# Patient Record
Sex: Male | Born: 1955 | Race: Black or African American | Hispanic: No | Marital: Married | Smoking: Current every day smoker
Health system: Southern US, Community
[De-identification: ages and names within clinical notes are randomized; demographics above are authoritative.]

## PROBLEM LIST (undated history)

## (undated) DIAGNOSIS — F1010 Alcohol abuse, uncomplicated: Secondary | ICD-10-CM

## (undated) DIAGNOSIS — Z860100 Personal history of colon polyps, unspecified: Secondary | ICD-10-CM

## (undated) DIAGNOSIS — B189 Chronic viral hepatitis, unspecified: Secondary | ICD-10-CM

## (undated) DIAGNOSIS — M1990 Unspecified osteoarthritis, unspecified site: Secondary | ICD-10-CM

## (undated) DIAGNOSIS — K759 Inflammatory liver disease, unspecified: Secondary | ICD-10-CM

## (undated) DIAGNOSIS — R1310 Dysphagia, unspecified: Secondary | ICD-10-CM

## (undated) DIAGNOSIS — B182 Chronic viral hepatitis C: Secondary | ICD-10-CM

## (undated) DIAGNOSIS — F1420 Cocaine dependence, uncomplicated: Secondary | ICD-10-CM

## (undated) DIAGNOSIS — J45909 Unspecified asthma, uncomplicated: Secondary | ICD-10-CM

## (undated) DIAGNOSIS — I10 Essential (primary) hypertension: Secondary | ICD-10-CM

## (undated) DIAGNOSIS — E119 Type 2 diabetes mellitus without complications: Secondary | ICD-10-CM

## (undated) HISTORY — DX: Chronic viral hepatitis C: B18.2

## (undated) HISTORY — PX: KNEE ARTHROPLASTY: SHX992

## (undated) HISTORY — DX: Dysphagia, unspecified: R13.10

## (undated) HISTORY — PX: COLONOSCOPY: SHX174

## (undated) HISTORY — DX: Cocaine dependence, uncomplicated: F14.20

## (undated) HISTORY — DX: Personal history of colonic polyps: Z86.010

## (undated) HISTORY — DX: Personal history of colon polyps, unspecified: Z86.0100

## (undated) HISTORY — PX: KNEE ARTHROSCOPY: SUR90

## (undated) HISTORY — DX: Alcohol abuse, uncomplicated: F10.10

## (undated) HISTORY — DX: Chronic viral hepatitis, unspecified: B18.9

---

## 2002-03-01 ENCOUNTER — Ambulatory Visit (HOSPITAL_COMMUNITY): Admission: RE | Admit: 2002-03-01 | Discharge: 2002-03-01 | Payer: Self-pay | Admitting: General Surgery

## 2002-05-12 ENCOUNTER — Emergency Department (HOSPITAL_COMMUNITY): Admission: EM | Admit: 2002-05-12 | Discharge: 2002-05-12 | Payer: Self-pay | Admitting: *Deleted

## 2002-09-12 ENCOUNTER — Encounter (HOSPITAL_COMMUNITY): Admission: RE | Admit: 2002-09-12 | Discharge: 2002-10-12 | Payer: Self-pay | Admitting: General Surgery

## 2003-11-13 ENCOUNTER — Ambulatory Visit (HOSPITAL_COMMUNITY): Admission: RE | Admit: 2003-11-13 | Discharge: 2003-11-13 | Payer: Self-pay | Admitting: General Surgery

## 2004-07-24 ENCOUNTER — Ambulatory Visit (HOSPITAL_COMMUNITY): Admission: RE | Admit: 2004-07-24 | Discharge: 2004-07-24 | Payer: Self-pay | Admitting: Internal Medicine

## 2005-07-02 ENCOUNTER — Ambulatory Visit (HOSPITAL_COMMUNITY): Admission: RE | Admit: 2005-07-02 | Discharge: 2005-07-02 | Payer: Self-pay | Admitting: General Surgery

## 2008-01-11 ENCOUNTER — Ambulatory Visit (HOSPITAL_COMMUNITY): Admission: RE | Admit: 2008-01-11 | Discharge: 2008-01-11 | Payer: Self-pay | Admitting: Family Medicine

## 2008-01-18 ENCOUNTER — Ambulatory Visit: Payer: Self-pay | Admitting: Gastroenterology

## 2008-02-10 ENCOUNTER — Encounter: Payer: Self-pay | Admitting: Gastroenterology

## 2008-02-13 ENCOUNTER — Ambulatory Visit (HOSPITAL_COMMUNITY): Admission: RE | Admit: 2008-02-13 | Discharge: 2008-02-13 | Payer: Self-pay | Admitting: Gastroenterology

## 2008-02-13 ENCOUNTER — Ambulatory Visit: Payer: Self-pay | Admitting: Gastroenterology

## 2008-02-13 ENCOUNTER — Encounter: Payer: Self-pay | Admitting: Gastroenterology

## 2008-02-21 ENCOUNTER — Ambulatory Visit (HOSPITAL_COMMUNITY): Admission: RE | Admit: 2008-02-21 | Discharge: 2008-02-21 | Payer: Self-pay | Admitting: Family Medicine

## 2008-02-29 ENCOUNTER — Encounter: Payer: Self-pay | Admitting: Gastroenterology

## 2008-03-13 ENCOUNTER — Ambulatory Visit: Payer: Self-pay | Admitting: Gastroenterology

## 2008-03-14 ENCOUNTER — Encounter: Payer: Self-pay | Admitting: Gastroenterology

## 2008-03-20 ENCOUNTER — Encounter: Payer: Self-pay | Admitting: Gastroenterology

## 2008-03-20 DIAGNOSIS — R1013 Epigastric pain: Secondary | ICD-10-CM | POA: Insufficient documentation

## 2008-03-20 DIAGNOSIS — F1010 Alcohol abuse, uncomplicated: Secondary | ICD-10-CM | POA: Insufficient documentation

## 2008-03-21 ENCOUNTER — Encounter: Admission: RE | Admit: 2008-03-21 | Discharge: 2008-03-21 | Payer: Self-pay | Admitting: Orthopedic Surgery

## 2008-03-30 ENCOUNTER — Telehealth: Payer: Self-pay | Admitting: Gastroenterology

## 2008-05-08 ENCOUNTER — Encounter: Admission: RE | Admit: 2008-05-08 | Discharge: 2008-05-08 | Payer: Self-pay | Admitting: Orthopedic Surgery

## 2008-06-01 ENCOUNTER — Ambulatory Visit (HOSPITAL_COMMUNITY): Admission: RE | Admit: 2008-06-01 | Discharge: 2008-06-01 | Payer: Self-pay | Admitting: Family Medicine

## 2008-06-29 ENCOUNTER — Ambulatory Visit (HOSPITAL_COMMUNITY): Admission: RE | Admit: 2008-06-29 | Discharge: 2008-06-29 | Payer: Self-pay | Admitting: Family Medicine

## 2008-09-05 ENCOUNTER — Ambulatory Visit (HOSPITAL_COMMUNITY): Admission: RE | Admit: 2008-09-05 | Discharge: 2008-09-05 | Payer: Self-pay | Admitting: Orthopedic Surgery

## 2008-09-20 ENCOUNTER — Encounter (HOSPITAL_COMMUNITY): Admission: RE | Admit: 2008-09-20 | Discharge: 2008-10-11 | Payer: Self-pay | Admitting: Orthopedic Surgery

## 2008-10-12 HISTORY — PX: TRIGGER FINGER RELEASE: SHX641

## 2008-10-15 ENCOUNTER — Encounter (HOSPITAL_COMMUNITY): Admission: RE | Admit: 2008-10-15 | Discharge: 2008-10-29 | Payer: Self-pay | Admitting: Orthopedic Surgery

## 2009-02-14 ENCOUNTER — Encounter: Payer: Self-pay | Admitting: Gastroenterology

## 2009-05-15 ENCOUNTER — Encounter: Admission: RE | Admit: 2009-05-15 | Discharge: 2009-05-15 | Payer: Self-pay | Admitting: Orthopedic Surgery

## 2009-06-02 ENCOUNTER — Encounter: Admission: RE | Admit: 2009-06-02 | Discharge: 2009-06-02 | Payer: Self-pay | Admitting: Orthopedic Surgery

## 2009-06-06 ENCOUNTER — Encounter (INDEPENDENT_AMBULATORY_CARE_PROVIDER_SITE_OTHER): Payer: Self-pay | Admitting: *Deleted

## 2009-07-27 ENCOUNTER — Encounter: Admission: RE | Admit: 2009-07-27 | Discharge: 2009-07-27 | Payer: Self-pay | Admitting: Neurosurgery

## 2009-08-15 ENCOUNTER — Telehealth (INDEPENDENT_AMBULATORY_CARE_PROVIDER_SITE_OTHER): Payer: Self-pay

## 2009-08-16 ENCOUNTER — Emergency Department (HOSPITAL_COMMUNITY): Admission: EM | Admit: 2009-08-16 | Discharge: 2009-08-16 | Payer: Self-pay | Admitting: Emergency Medicine

## 2009-09-09 ENCOUNTER — Encounter (HOSPITAL_COMMUNITY): Admission: RE | Admit: 2009-09-09 | Discharge: 2009-10-09 | Payer: Self-pay | Admitting: Neurosurgery

## 2009-10-12 HISTORY — PX: ESOPHAGOGASTRODUODENOSCOPY: SHX1529

## 2009-11-19 ENCOUNTER — Encounter: Payer: Self-pay | Admitting: Gastroenterology

## 2010-06-20 DIAGNOSIS — Z860100 Personal history of colon polyps, unspecified: Secondary | ICD-10-CM | POA: Insufficient documentation

## 2010-06-20 DIAGNOSIS — B182 Chronic viral hepatitis C: Secondary | ICD-10-CM | POA: Insufficient documentation

## 2010-06-20 DIAGNOSIS — F1420 Cocaine dependence, uncomplicated: Secondary | ICD-10-CM | POA: Insufficient documentation

## 2010-06-24 ENCOUNTER — Ambulatory Visit: Payer: Self-pay | Admitting: Gastroenterology

## 2010-06-24 DIAGNOSIS — R1084 Generalized abdominal pain: Secondary | ICD-10-CM | POA: Insufficient documentation

## 2010-06-24 DIAGNOSIS — R1319 Other dysphagia: Secondary | ICD-10-CM | POA: Insufficient documentation

## 2010-06-30 ENCOUNTER — Ambulatory Visit (HOSPITAL_COMMUNITY): Admission: RE | Admit: 2010-06-30 | Discharge: 2010-06-30 | Payer: Self-pay | Admitting: Gastroenterology

## 2010-06-30 ENCOUNTER — Ambulatory Visit: Payer: Self-pay | Admitting: Gastroenterology

## 2010-07-09 ENCOUNTER — Encounter (INDEPENDENT_AMBULATORY_CARE_PROVIDER_SITE_OTHER): Payer: Self-pay
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
## 2010-11-13 NOTE — Letter (Signed)
Summary: TCS/EGD/ED ORDER  TCS/EGD/ED ORDER   Imported By: Ave Filter 06/24/2010 14:41:09  _____________________________________________________________________  External Attachment:    Type:   Image     Comment:   External Document

## 2010-11-13 NOTE — Letter (Signed)
Summary: OFFICE NOTE/LIVER CLINIC  OFFICE NOTE/LIVER CLINIC   Imported By: Diana Eves 11/19/2009 16:19:43  _____________________________________________________________________  External Attachment:    Type:   Image     Comment:   External Document

## 2010-11-13 NOTE — Letter (Signed)
Summary: Plan of Care, Need to Discuss  [facility]  [address]   [place], [place] [address]   Phone: [phone]  Fax: [phone]    July 09, 2010  [name] [address] [place], [place]  [address] Jul 15, 1956   Dear [name],   We are writing this letter to inform you of treatment plans and/or discuss your plan of care.  We have tried several times to contact you; however, we have yet to reach you.  We ask that you please contact our office for follow-up on your gastrointestinal issues.  We can  be reached at [phone] to schedule an appointment, or to speak with someone regarding your health care needs.  Please do not neglect your health.   Sincerely,    Cloria Spring LPN  [facility] Ph: [phone]    Fax: [phone]

## 2010-12-05 ENCOUNTER — Encounter (INDEPENDENT_AMBULATORY_CARE_PROVIDER_SITE_OTHER): Payer: Self-pay | Admitting: *Deleted

## 2010-12-09 NOTE — Letter (Signed)
Summary: Recall Office Visit  [facility]  [address]   [place], [place] [address]   Phone: [phone]  Fax: [phone]      December 05, 2010   LELAND RAVER [address] [place], [place]  [address] 05/26/1956   Dear Mr. Markel,   According to our records, it is time for you to schedule a follow-up office visit with [place].   At your convenience, please call [phone] to schedule an office visit. If you have any questions, concerns, or feel that this letter is in error, we would appreciate your call.   Sincerely,    Diana Eves  [facility] Ph: [phone]   Fax: [phone]

## 2011-01-14 LAB — URINALYSIS, ROUTINE W REFLEX MICROSCOPIC
Glucose, UA: 100 mg/dL — AB
Hgb urine dipstick: NEGATIVE
Ketones, ur: 15 mg/dL — AB
Leukocytes, UA: NEGATIVE
Nitrite: POSITIVE — AB
Protein, ur: 100 mg/dL — AB
Specific Gravity, Urine: 1.025 (ref 1.005–1.030)
Urobilinogen, UA: 1 mg/dL (ref 0.0–1.0)
pH: 5.5 (ref 5.0–8.0)

## 2011-01-14 LAB — DIFFERENTIAL
Basophils Absolute: 0 10*3/uL (ref 0.0–0.1)
Basophils Relative: 0 % (ref 0–1)
Eosinophils Absolute: 0.3 10*3/uL (ref 0.0–0.7)
Eosinophils Relative: 3 % (ref 0–5)
Lymphocytes Relative: 22 % (ref 12–46)
Lymphs Abs: 2.1 10*3/uL (ref 0.7–4.0)
Monocytes Absolute: 0.7 10*3/uL (ref 0.1–1.0)
Monocytes Relative: 7 % (ref 3–12)
Neutro Abs: 6.4 10*3/uL (ref 1.7–7.7)
Neutrophils Relative %: 68 % (ref 43–77)

## 2011-01-14 LAB — COMPREHENSIVE METABOLIC PANEL
ALT: 61 U/L — ABNORMAL HIGH (ref 0–53)
AST: 41 U/L — ABNORMAL HIGH (ref 0–37)
Albumin: 4.3 g/dL (ref 3.5–5.2)
Alkaline Phosphatase: 89 U/L (ref 39–117)
BUN: 13 mg/dL (ref 6–23)
CO2: 23 mEq/L (ref 19–32)
Calcium: 9.2 mg/dL (ref 8.4–10.5)
Chloride: 105 mEq/L (ref 96–112)
Creatinine, Ser: 1.35 mg/dL (ref 0.4–1.5)
GFR calc Af Amer: >60 mL/min (ref >60)
GFR calc non Af Amer: 55 mL/min — ABNORMAL LOW (ref >60)
Glucose, Bld: 117 mg/dL — ABNORMAL HIGH (ref 70–99)
Potassium: 3.5 mEq/L (ref 3.5–5.1)
Sodium: 138 mEq/L (ref 135–145)
Total Bilirubin: 1.2 mg/dL (ref 0.3–1.2)
Total Protein: 8.2 g/dL (ref 6.0–8.3)

## 2011-01-14 LAB — CBC
HCT: 51.4 % (ref 39.0–52.0)
Hemoglobin: 18.3 g/dL — ABNORMAL HIGH (ref 13.0–17.0)
MCHC: 35.6 g/dL (ref 30.0–36.0)
MCV: 91.7 fL (ref 78.0–100.0)
Platelets: 265 10*3/uL (ref 150–400)
RBC: 5.6 MIL/uL (ref 4.22–5.81)
RDW: 12.8 % (ref 11.5–15.5)
WBC: 9.4 10*3/uL (ref 4.0–10.5)

## 2011-01-14 LAB — URINE MICROSCOPIC-ADD ON
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
## 2011-02-24 NOTE — Assessment & Plan Note (Signed)
NAME:  [name], [name]              CHART#:  [id number]   DATE:  03/13/2008                       DOB:  1956/04/28   REFERRING PHYSICIAN:  Annia Friendly. Hill, MD.   PROBLEM LIST:  1. History of H. pylori gastritis with completion of amoxicillin and      Flagyl.  2. History of alcohol abuse and substance abuse with rehab in March      2009.  3. Epigastric abdominal pain.  4. Duodenal polyp on EGD in May 2009 with the pathology showing benign      small bowel mucosa.  5. Hepatitis C.  6. Personal history of colon polyps.   SUBJECTIVE:  Mr. Wainwright is a 55 year old male who was initially seen  by me in May 2009 for his endoscopy.  He was complaining of epigastric  pain and difficulty swallowing.  His upper endoscopy showed a normal  esophagus with patchy erythema in his body of the stomach and biopsies  were positive for H. pylori.  He took his amoxicillin and his Flagyl.  He also was given information on hepatitis C preventing transmission to  his wife or any other family members.  He returns complaining that he  still has pain in his epigastrium.  He has had no alcohol for 60 days.  He used to drink alcohol on the weekends and snort cocaine.  He said he  snorted cocaine off and on for a year and shared straws.  Prior to  approximately 3 months ago, Nexium worked for his epigastric pain, but  now it is not because he is having pain in his abdomen daily in spite of  being treated for H. Pylori gastritis.  He has nausea when he has this  burning sensation.  He has only vomited once.  He smokes a pack a day.   ALLERGIES:  No known drug allergies.   MEDICATIONS:  1. Nexium 40 mg daily.  2. Benicar daily.   OBJECTIVE:  VITAL SIGNS:  Weight 208 pounds, height 5 feet 9, BMI 30.7  (obese), temperature 97.8, blood pressure 130/90, and pulse 88.GENERAL:  He is in no apparent distress.  Alert and oriented x4.LUNGS:  Clear to  auscultation bilaterally.CARDIOVASCULAR:  Regular rhythm.  No  murmurs.ABDOMEN:  Bowel sounds are present, soft, with mild tenderness  to palpation in the epigastrium without rebound or guarding.   ASSESSMENT:  Mr. Rylee is a 55 year old male who has history of  alcohol abuse and substance abuse with continued epigastric pain in  spite of appropriate therapy for H. pylori gastritis.  He also has  hepatitis C.  He has gastroesophageal reflux disease and is maintained  on Nexium once a day and had no evidence of erosive esophagitis.  The  differential diagnosis for his continued epigastric pain includes  inadequately controlled gastroesophageal reflux disease or chronic  pancreatitis due to his alcohol abuse, which was not seen on ultrasound  .  He has a low likelihood of having pancreatic malignancy.   Thank you for allowing me to see Mr. Tingler in consultation.  My  recommendations are as follows.   RECOMMENDATIONS:  1. Mr. Gladish is given his discharge instructions in writing for the      management of his abdominal pain and his hepatitis C.  He is asked      to increase his  Nexium to 40 mg 30 minutes before breakfast and      dinner, and he is given a prescription for the new regimen.  2. He is asked to follow reflux recommendations and given a handout.  3. I will give him a referral to [facility] for hepatitis C treatment.  He      did not want to wait for an appointment in [place], because he      is concerned about having hepatitis C because his sister reportedly      had hepatitis C from a blood transfusion and needed a liver      transplant.  He has had no evidence of cirrhosis on ultrasound      obtained at [facility] in April 2009.  4. Will schedule an endoscopic ultrasound with Dr. Wendall Papa to      evaluate for chronic pancreatitis.  If he has chronic pancreatitis,      then we would begin an outpatient regimen for pain management,      which would include Neurontin and/or a tricyclic antidepressant.  5. He has a follow  up appointment to see me in 4 months.       Kassie Mends, M.D.  Electronically Signed     SM/MEDQ  D:  03/13/2008  T:  03/14/2008  Job:  [id number]   cc:   Annia Friendly. Loleta Chance, MD

## 2011-02-24 NOTE — Op Note (Signed)
NAMEVALLEN, CALABRESE             ACCOUNT NO.:  [id number]   MEDICAL RECORD NO.:  [id number]          PATIENT TYPE:  AMB   LOCATION:  SDS                          FACILITY:  [facility]   PHYSICIAN:  Myrtie Neither, MD      DATE OF BIRTH:  Aug 29, 1956   DATE OF PROCEDURE:  09/05/2008  DATE OF DISCHARGE:  09/05/2008                               OPERATIVE REPORT   PREOPERATIVE DIAGNOSIS:  Internal derangement with meniscal tear, right  knee.   POSTOPERATIVE DIAGNOSES:  1. Chondral defect, lateral femoral condyle.  2. Lateral meniscal tear.  3. Chronic synovitis, right knee.   ANESTHESIA:  General.   PROCEDURE:  1. Arthroscopic complete lateral meniscectomy.  2. Chondroplasty, lateral femoral condyle.  3. Synovectomy.   The patient was taken to the operating room after given adequate preop  medications, given general anesthesia and intubated.  Right knee was  prepped with DuraPrep and draped in the sterile manner.  Tourniquet was  used for hemostasis.  An one-half inch puncture wound was made along the  anterior, medial, and lateral joint line.  Inflow was through the medial  suprapatellar pouch area.  Inspection of the joint revealed chronic  synovitic changes involving the anterior compartment, medial and lateral  compartment.  This was completely resected.  Chondroplasty was then done  of the large 50% sized piece chondral defect involving the lateral  femoral condyle.  The lateral meniscus was severely chewed up with loose  fragments with use of basket forceps and meniscal shaver.  Complete  synovectomy was done.  Further inspection of the joint did not reveal  any other loose fragments.  ACL was intact.  Medial compartment was well  preserved.  Wound closure was then done with 0 nylon.  15 mL of 0.25%  plain Marcaine was injected into the joint.  Compressive dressing was  applied.  The patient tolerated the procedure quite well and went to the  recovery room in stable and  satisfactory position.   The patient is being discharged on Percocet 1-2 q.6 p.r.n. for pain, ice  packs, nonweightbearing on the right side with the use of crutches and  return to the office in 1 week.  The patient is being discharged in  stable and satisfactory position.      Myrtie Neither, MD  Electronically Signed     AC/MEDQ  D:  09/05/2008  T:  09/05/2008  Job:  [id number]

## 2011-02-24 NOTE — Op Note (Signed)
[name], [name]             ACCOUNT NO.:  [id number]   MEDICAL RECORD NO.:  [id number]          PATIENT TYPE:  AMB   LOCATION:  DAY                           FACILITY:  [facility]   PHYSICIAN:  Kassie Mends, M.D.      DATE OF BIRTH:  1956/04/06   DATE OF PROCEDURE:  02/13/2008  DATE OF DISCHARGE:                               OPERATIVE REPORT   REFERRING PHYSICIAN:  Annia Friendly. Hill, MD   PROCEDURE:  Esophagogastroduodenoscopy with cold forceps biopsy of the  duodenal and gastric mucosa.   INDICATION FOR EXAM:  [name] is a 55 year old male who complains  of epigastric pain and difficulty swallowing.  He has a history of  snorting cocaine and alcohol use.  He has been in rehab for the last  month and a half.  He has a longstanding history of gastroesophageal  reflux disease and is maintained on Nexium once a day.   FINDINGS:  1. Normal esophagus without evidence of Barrett's, mass, erosion,      ulceration, or stricture.  2. Patchy erythema in the body of the stomach without erosion or      ulceration.  Biopsies obtained via cold forceps in the antrum and      the body to evaluate for H. pylori gastritis or eosinophilic      gastritis.  3. Normal duodenal bulb.  4. A 2- to 3-mm sessile duodenal polyp removed via cold forceps.   DIAGNOSES:  No obvious source for [name]'s abdominal pain or  dysphagia identified.  The differential diagnosis for his epigastric  pain includes uncontrolled reflux disease or chronic pancreatitis which  was not readily apparent on abdominal ultrasound.   RECOMMENDATIONS:  1. Recommended to [name] that he not share toothbrushes or      razors with anyone.  He was given a handout on hepatitis C in an      envelope because he has not discussed his hepatitis C diagnosis      with his wife.  2. Will draw hepatic function panel, PT, and INR today.  He does not      need yearly surveillance for hepatocellular carcinoma because his  abdominal ultrasound show no evidence of cirrhosis.  3. He should follow recommendations of the [facility] in regards to      managing his gastroesophageal reflux disease.  He is also given      additional information on reflux.  4. No aspirin, NSAIDs, or anticoagulation for three days.  5. Follow up appointment in 4 weeks with Dr. Cira Servant regarding his      abdominal pain and his hepatitis C.  I discussed with him that he      can be treated in [place], at [facility] or at [facility].  6. Will call [name] with the results of his biopsies.  7. He is to increase his Nexium to 40 mg twice daily 30 minutes before      breakfast and supper.  He should continue to avoid alcohol.   MEDICATIONS:  MAC provided by Anesthesia.   PROCEDURE TECHNIQUE:  Physical exam  was performed and informed consent  was obtained.  The patient was explained the benefits, risks, and  alternatives to the procedure.  The patient was connected to monitor and  placed in left lateral position.  Continuous oxygen was provided by  nasal cannula and IV medicine administered through an indwelling  cannula.  After administration of sedation, the patient's esophagus was  intubated and scope was advanced under direct visualization to the  second portion of the duodenum.  The scope was removed slowly by  carefully examining the color, texture, anatomy, and integrity of the  mucosa on the way out.  The patient was recovered in Endoscopy and  discharged home in satisfactory condition.   PATH:  H. pylori gastritis, Rx: FLAG/AMOX/NEXIUM BID for 7 days.  TCS in  2010 for personal history of polyps.      Kassie Mends, M.D.  Electronically Signed     SM/MEDQ  D:  02/13/2008  T:  02/13/2008  Job:  [id number]   cc:   Annia Friendly. Loleta Chance, MD  Fax: [phone]
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
## 2011-02-27 NOTE — H&P (Signed)
[name], [name]             ACCOUNT NO.:  [id number]   MEDICAL RECORD NO.:  [id number]          PATIENT TYPE:  AMB   LOCATION:  DAY                           FACILITY:  [facility]   PHYSICIAN:  Jerolyn Shin C. Katrinka Blazing, M.D.   DATE OF BIRTH:  [birth year]   DATE OF ADMISSION:  DATE OF DISCHARGE:  LH                                HISTORY & PHYSICAL   HISTORY OF PRESENT ILLNESS:  [name] is a 55 year old male with  history of multiple colon polyps, status post polypectomy on sigmoid colon  in cecum in 2003.  The patient had a large tubular villous adenoma measuring  greater than 1.5 cm.  He had recent evaluation which showed guaiac positive  stool.  He is having repeat colonoscopy to rule out the recurrent polyp.   PAST MEDICAL HISTORY:  1.  He has hypertension.  2.  Gastroesophageal reflux disease.  3.  Gastritis.  4.  He also has seasonal allergies.   MEDICATIONS:  1.  Avalide 300/12.5 mg daily.  2.  Allegra 180 mg daily.  3.  Nexium 40 mg daily.   PAST SURGICAL HISTORY:  None.   ALLERGIES:  None.   SOCIAL HISTORY:  He is married, employed, smokes one pack of cigarettes per  day.   FAMILY HISTORY:  Positive for __________.   PHYSICAL EXAMINATION:  VITAL SIGNS:  Blood pressure 138/86, pulse 88,  respiration 20, weight 230 pounds.  HEENT:  Unremarkable.  NECK:  Supple without JVD or bruit.  CHEST:  Clear to auscultation.  HEART:  Regular rate and rhythm without murmur, gallop, or rub.  ABDOMEN:  Soft, nontender, no masses.  Normal active bowel sounds.  RECTAL:  Normal, no masses.  Stool guaiac positive.  EXTREMITIES:  No cyanosis, clubbing, or edema.  NEUROLOGIC:  No focal motor, sensory, or cerebellar deficits.   IMPRESSION:  1.  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  2.  Guaiac positive stool.  3.  Hypertension.  4.  Gastroesophageal reflux disease.  5.  Gastritis.   PLAN:  Total colonoscopy/     Lero   LCS/MEDQ  D:  07/23/2004  T:   07/23/2004  Job:  [id number]

## 2011-02-27 NOTE — Op Note (Signed)
NAME:  [name], [name]                       ACCOUNT NO.:  [id number]   MEDICAL RECORD NO.:  [id number]                   PATIENT TYPE:  AMB   LOCATION:  DAY                                  FACILITY:  [facility]   PHYSICIAN:  Jerolyn Shin C. Katrinka Blazing, M.D.                DATE OF BIRTH:  [birth year]   DATE OF PROCEDURE:  DATE OF DISCHARGE:                                 OPERATIVE REPORT   PREOPERATIVE DIAGNOSIS:  Mass of right chin, 2.5 cm.   POSTOPERATIVE DIAGNOSIS:  Mass of right chin, 2.5 cm.   PROCEDURE:  Excision of mass of right chin, 2.5 cm.   SURGEON:  Dirk Dress. Katrinka Blazing, M.D.   DESCRIPTION:  The procedure was done under MAC with 1% Xylocaine local  infiltration with epinephrine.  The chin was prepped and draped in a sterile  field.  A field block was placed.  A longitudinal incision was made over the  mass following the angle of the jaw.  The mass was extended down into the  subcutaneous tissue.  In the subcutaneous tissue there was an encapsulated  soft tissue mass.  Using blunt dissection and the Metzenbaum scissors the  mass was excised intact without difficulty.  The deep tissues were closed  with 3-0 chromic.  The skin was closed with 4-0 Prolene interrupted.  The  patient tolerated the procedure well.  Dressing was placed.  He was awakened  from his deep slumber.  He was transferred to post-anesthesia care unit for  short-term monitoring.      ___________________________________________                                            Dirk Dress. Katrinka Blazing, M.D.   LCS/MEDQ  D:  11/13/2003  T:  11/13/2003  Job:  [id number]

## 2011-02-27 NOTE — H&P (Signed)
[facility]  Patient:    [name], [name] Visit Number: [id number] MRN: [id number]          Service Type: END Location: DAY Attending Physician:  Dessa Phi Dictated by:   Elpidio Anis, M.D. Admit Date:  03/01/2002                           History and Physical  HISTORY OF PRESENT ILLNESS:  A 55 year old male with a history of recurrent abdominal pain with rectal bleeding over the past year.  He has had at least three episodes of rectal bleeding.  He has not had constipation.  The hemoglobin has been stable.  The patient is scheduled for upper and lower endoscopy.  MEDICATIONS: 1. Nexium 40 mg q.d. 2. Claritin D one q.12h. 3. Viagra p.r.n.  ALLERGIES:  None.  PHYSICAL EXAMINATION:  Blood pressure 150/90, pulse 80, respirations 22.  WEIGHT:  206 pounds.  HEENT:  No abnormality noted.  NECK:  Supple without JVD or bruits.  CHEST:  Clear to auscultation.  HEART:  Regular rate and rhythm without murmurs, rubs, or gallops.  ABDOMEN:  Soft and nontender.  No masses.  RECTAL:  Normal.  Stool guaiac negative.  EXTREMITIES:  No clubbing, cyanosis, or edema.  NEUROLOGIC:  No focal motor, sensory, or cerebellar deficits.  IMPRESSION: 1. Gastroesophageal reflux disease. 2. Recurrent rectal bleeding.  PLAN:  EGD and colonoscopy. Dictated by:   Elpidio Anis, M.D. Attending Physician:  Dessa Phi DD:  02/28/02 TD:  02/28/02 Job: [id number]

## 2011-07-14 LAB — URINALYSIS, ROUTINE W REFLEX MICROSCOPIC
Bilirubin Urine: NEGATIVE
Glucose, UA: NEGATIVE
Hgb urine dipstick: NEGATIVE
Ketones, ur: NEGATIVE
Nitrite: NEGATIVE
Protein, ur: NEGATIVE
Specific Gravity, Urine: 1.022
Urobilinogen, UA: 0.2
pH: 5.5

## 2011-07-14 LAB — COMPREHENSIVE METABOLIC PANEL
ALT: 43
AST: 32
Albumin: 3.9
Alkaline Phosphatase: 100
BUN: 7
CO2: 29
Calcium: 9.5
Chloride: 107
Creatinine, Ser: 1.04
GFR calc Af Amer: >60
GFR calc non Af Amer: >60
Glucose, Bld: 116 — ABNORMAL HIGH
Potassium: 3.9
Sodium: 142
Total Bilirubin: 0.8
Total Protein: 6.9

## 2011-07-14 LAB — CBC
HCT: 43.7
Hemoglobin: 15.1
MCHC: 34.6
MCV: 93.1
Platelets: 243
RBC: 4.69
RDW: 12.9
WBC: 8.1

## 2011-07-14 LAB — BILIRUBIN, DIRECT: Bilirubin, Direct: 0.2

## 2011-07-14 LAB — APTT: aPTT: 51 — ABNORMAL HIGH

## 2011-07-14 LAB — PROTIME-INR
INR: 0.9
Prothrombin Time: 12.3

## 2011-12-21 ENCOUNTER — Other Ambulatory Visit: Payer: Self-pay | Admitting: Dermatology

## 2013-04-05 ENCOUNTER — Other Ambulatory Visit: Payer: Self-pay | Admitting: Dermatology

## 2014-01-16 ENCOUNTER — Other Ambulatory Visit: Payer: Self-pay | Admitting: Dermatology

## 2014-11-21 DIAGNOSIS — I10 Essential (primary) hypertension: Secondary | ICD-10-CM | POA: Diagnosis not present

## 2014-11-21 DIAGNOSIS — E785 Hyperlipidemia, unspecified: Secondary | ICD-10-CM | POA: Diagnosis not present

## 2014-11-21 DIAGNOSIS — M542 Cervicalgia: Secondary | ICD-10-CM | POA: Diagnosis not present

## 2015-01-15 DIAGNOSIS — M25512 Pain in left shoulder: Secondary | ICD-10-CM | POA: Diagnosis not present

## 2015-01-15 DIAGNOSIS — I10 Essential (primary) hypertension: Secondary | ICD-10-CM | POA: Diagnosis not present

## 2015-04-22 DIAGNOSIS — I10 Essential (primary) hypertension: Secondary | ICD-10-CM | POA: Diagnosis not present

## 2015-04-22 DIAGNOSIS — E669 Obesity, unspecified: Secondary | ICD-10-CM | POA: Diagnosis not present

## 2015-05-13 ENCOUNTER — Encounter: Payer: Self-pay | Admitting: Gastroenterology

## 2015-07-24 DIAGNOSIS — Z125 Encounter for screening for malignant neoplasm of prostate: Secondary | ICD-10-CM | POA: Diagnosis not present

## 2015-07-24 DIAGNOSIS — E785 Hyperlipidemia, unspecified: Secondary | ICD-10-CM | POA: Diagnosis not present

## 2015-07-24 DIAGNOSIS — Z23 Encounter for immunization: Secondary | ICD-10-CM | POA: Diagnosis not present

## 2015-07-24 DIAGNOSIS — I10 Essential (primary) hypertension: Secondary | ICD-10-CM | POA: Diagnosis not present

## 2015-09-17 ENCOUNTER — Other Ambulatory Visit: Payer: Self-pay | Admitting: Orthopedic Surgery

## 2015-09-17 DIAGNOSIS — M1712 Unilateral primary osteoarthritis, left knee: Secondary | ICD-10-CM | POA: Diagnosis not present

## 2015-09-17 DIAGNOSIS — M25562 Pain in left knee: Secondary | ICD-10-CM

## 2015-09-21 ENCOUNTER — Ambulatory Visit
Admission: RE | Admit: 2015-09-21 | Discharge: 2015-09-21 | Disposition: A | Discharge status: Home / Self Care | Payer: Self-pay | Source: Ambulatory Visit | Attending: Orthopedic Surgery | Admitting: Orthopedic Surgery

## 2015-09-21 DIAGNOSIS — M25562 Pain in left knee: Secondary | ICD-10-CM

## 2015-09-23 DIAGNOSIS — M1712 Unilateral primary osteoarthritis, left knee: Secondary | ICD-10-CM | POA: Diagnosis not present

## 2015-10-23 DIAGNOSIS — M1712 Unilateral primary osteoarthritis, left knee: Secondary | ICD-10-CM | POA: Diagnosis not present

## 2015-10-23 DIAGNOSIS — Y939 Activity, unspecified: Secondary | ICD-10-CM | POA: Diagnosis not present

## 2015-10-23 DIAGNOSIS — S83232A Complex tear of medial meniscus, current injury, left knee, initial encounter: Secondary | ICD-10-CM | POA: Diagnosis not present

## 2015-10-23 DIAGNOSIS — G8918 Other acute postprocedural pain: Secondary | ICD-10-CM | POA: Diagnosis not present

## 2015-10-23 DIAGNOSIS — S83272A Complex tear of lateral meniscus, current injury, left knee, initial encounter: Secondary | ICD-10-CM | POA: Diagnosis not present

## 2015-10-23 DIAGNOSIS — S83242A Other tear of medial meniscus, current injury, left knee, initial encounter: Secondary | ICD-10-CM | POA: Diagnosis not present

## 2015-10-23 DIAGNOSIS — M94262 Chondromalacia, left knee: Secondary | ICD-10-CM | POA: Diagnosis not present

## 2015-10-23 DIAGNOSIS — S83282A Other tear of lateral meniscus, current injury, left knee, initial encounter: Secondary | ICD-10-CM | POA: Diagnosis not present

## 2015-10-23 DIAGNOSIS — Y929 Unspecified place or not applicable: Secondary | ICD-10-CM | POA: Diagnosis not present

## 2015-10-23 DIAGNOSIS — Y999 Unspecified external cause status: Secondary | ICD-10-CM | POA: Diagnosis not present

## 2015-10-28 DIAGNOSIS — M25562 Pain in left knee: Secondary | ICD-10-CM | POA: Diagnosis not present

## 2015-10-28 DIAGNOSIS — S83242D Other tear of medial meniscus, current injury, left knee, subsequent encounter: Secondary | ICD-10-CM | POA: Diagnosis not present

## 2015-10-28 DIAGNOSIS — M25662 Stiffness of left knee, not elsewhere classified: Secondary | ICD-10-CM | POA: Diagnosis not present

## 2015-10-28 DIAGNOSIS — S83262D Peripheral tear of lateral meniscus, current injury, left knee, subsequent encounter: Secondary | ICD-10-CM | POA: Diagnosis not present

## 2015-10-31 ENCOUNTER — Ambulatory Visit (HOSPITAL_COMMUNITY)
Admission: RE | Admit: 2015-10-31 | Discharge: 2015-10-31 | Disposition: A | Discharge status: Home / Self Care | Payer: Medicare Other | Source: Ambulatory Visit | Attending: Cardiovascular Disease | Admitting: Cardiovascular Disease

## 2015-10-31 ENCOUNTER — Other Ambulatory Visit (HOSPITAL_COMMUNITY): Payer: Self-pay | Admitting: Orthopedic Surgery

## 2015-10-31 DIAGNOSIS — S83242D Other tear of medial meniscus, current injury, left knee, subsequent encounter: Secondary | ICD-10-CM | POA: Diagnosis not present

## 2015-10-31 DIAGNOSIS — R2242 Localized swelling, mass and lump, left lower limb: Secondary | ICD-10-CM | POA: Diagnosis not present

## 2015-10-31 DIAGNOSIS — M79605 Pain in left leg: Secondary | ICD-10-CM

## 2015-10-31 DIAGNOSIS — M7989 Other specified soft tissue disorders: Secondary | ICD-10-CM | POA: Insufficient documentation

## 2015-10-31 DIAGNOSIS — M79662 Pain in left lower leg: Secondary | ICD-10-CM | POA: Insufficient documentation

## 2015-10-31 DIAGNOSIS — S83282D Other tear of lateral meniscus, current injury, left knee, subsequent encounter: Secondary | ICD-10-CM | POA: Diagnosis not present

## 2015-11-07 DIAGNOSIS — S83282D Other tear of lateral meniscus, current injury, left knee, subsequent encounter: Secondary | ICD-10-CM | POA: Diagnosis not present

## 2015-11-07 DIAGNOSIS — S83242D Other tear of medial meniscus, current injury, left knee, subsequent encounter: Secondary | ICD-10-CM | POA: Diagnosis not present

## 2015-11-20 DIAGNOSIS — S83242D Other tear of medial meniscus, current injury, left knee, subsequent encounter: Secondary | ICD-10-CM | POA: Diagnosis not present

## 2015-11-20 DIAGNOSIS — M25662 Stiffness of left knee, not elsewhere classified: Secondary | ICD-10-CM | POA: Diagnosis not present

## 2015-11-20 DIAGNOSIS — S83262D Peripheral tear of lateral meniscus, current injury, left knee, subsequent encounter: Secondary | ICD-10-CM | POA: Diagnosis not present

## 2015-11-20 DIAGNOSIS — M25562 Pain in left knee: Secondary | ICD-10-CM | POA: Diagnosis not present

## 2015-11-25 DIAGNOSIS — I10 Essential (primary) hypertension: Secondary | ICD-10-CM | POA: Diagnosis not present

## 2015-11-25 DIAGNOSIS — M545 Low back pain: Secondary | ICD-10-CM | POA: Diagnosis not present

## 2015-11-26 DIAGNOSIS — S83242D Other tear of medial meniscus, current injury, left knee, subsequent encounter: Secondary | ICD-10-CM | POA: Diagnosis not present

## 2015-11-26 DIAGNOSIS — M25662 Stiffness of left knee, not elsewhere classified: Secondary | ICD-10-CM | POA: Diagnosis not present

## 2015-11-26 DIAGNOSIS — S83262D Peripheral tear of lateral meniscus, current injury, left knee, subsequent encounter: Secondary | ICD-10-CM | POA: Diagnosis not present

## 2015-11-26 DIAGNOSIS — M25562 Pain in left knee: Secondary | ICD-10-CM | POA: Diagnosis not present

## 2015-11-28 DIAGNOSIS — M25662 Stiffness of left knee, not elsewhere classified: Secondary | ICD-10-CM | POA: Diagnosis not present

## 2015-11-28 DIAGNOSIS — M25562 Pain in left knee: Secondary | ICD-10-CM | POA: Diagnosis not present

## 2015-11-28 DIAGNOSIS — S83262D Peripheral tear of lateral meniscus, current injury, left knee, subsequent encounter: Secondary | ICD-10-CM | POA: Diagnosis not present

## 2015-11-28 DIAGNOSIS — S83242D Other tear of medial meniscus, current injury, left knee, subsequent encounter: Secondary | ICD-10-CM | POA: Diagnosis not present

## 2015-12-03 DIAGNOSIS — M25662 Stiffness of left knee, not elsewhere classified: Secondary | ICD-10-CM | POA: Diagnosis not present

## 2015-12-03 DIAGNOSIS — S83242D Other tear of medial meniscus, current injury, left knee, subsequent encounter: Secondary | ICD-10-CM | POA: Diagnosis not present

## 2015-12-03 DIAGNOSIS — S83262D Peripheral tear of lateral meniscus, current injury, left knee, subsequent encounter: Secondary | ICD-10-CM | POA: Diagnosis not present

## 2015-12-03 DIAGNOSIS — M25562 Pain in left knee: Secondary | ICD-10-CM | POA: Diagnosis not present

## 2015-12-05 DIAGNOSIS — S83242D Other tear of medial meniscus, current injury, left knee, subsequent encounter: Secondary | ICD-10-CM | POA: Diagnosis not present

## 2015-12-05 DIAGNOSIS — M25562 Pain in left knee: Secondary | ICD-10-CM | POA: Diagnosis not present

## 2015-12-05 DIAGNOSIS — S83262D Peripheral tear of lateral meniscus, current injury, left knee, subsequent encounter: Secondary | ICD-10-CM | POA: Diagnosis not present

## 2015-12-05 DIAGNOSIS — M25662 Stiffness of left knee, not elsewhere classified: Secondary | ICD-10-CM | POA: Diagnosis not present

## 2015-12-10 DIAGNOSIS — M25662 Stiffness of left knee, not elsewhere classified: Secondary | ICD-10-CM | POA: Diagnosis not present

## 2015-12-10 DIAGNOSIS — S83242D Other tear of medial meniscus, current injury, left knee, subsequent encounter: Secondary | ICD-10-CM | POA: Diagnosis not present

## 2015-12-10 DIAGNOSIS — M25562 Pain in left knee: Secondary | ICD-10-CM | POA: Diagnosis not present

## 2015-12-10 DIAGNOSIS — S83262D Peripheral tear of lateral meniscus, current injury, left knee, subsequent encounter: Secondary | ICD-10-CM | POA: Diagnosis not present

## 2015-12-20 DIAGNOSIS — M25662 Stiffness of left knee, not elsewhere classified: Secondary | ICD-10-CM | POA: Diagnosis not present

## 2015-12-20 DIAGNOSIS — S83262D Peripheral tear of lateral meniscus, current injury, left knee, subsequent encounter: Secondary | ICD-10-CM | POA: Diagnosis not present

## 2015-12-20 DIAGNOSIS — M25562 Pain in left knee: Secondary | ICD-10-CM | POA: Diagnosis not present

## 2015-12-26 DIAGNOSIS — M25562 Pain in left knee: Secondary | ICD-10-CM | POA: Diagnosis not present

## 2016-01-06 ENCOUNTER — Ambulatory Visit (INDEPENDENT_AMBULATORY_CARE_PROVIDER_SITE_OTHER): Payer: Self-pay | Admitting: Neurology

## 2016-01-06 ENCOUNTER — Encounter: Payer: Self-pay | Admitting: Neurology

## 2016-01-06 ENCOUNTER — Ambulatory Visit (INDEPENDENT_AMBULATORY_CARE_PROVIDER_SITE_OTHER): Payer: Medicare Other | Admitting: Neurology

## 2016-01-06 DIAGNOSIS — R1084 Generalized abdominal pain: Secondary | ICD-10-CM

## 2016-01-06 DIAGNOSIS — M79605 Pain in left leg: Secondary | ICD-10-CM | POA: Diagnosis not present

## 2016-01-06 NOTE — Progress Notes (Signed)
Please refer to EMG and nerve conduction study procedure note. 

## 2016-01-06 NOTE — Procedures (Signed)
     HISTORY:  [name] is a 60 year old gentleman who underwent knee surgery on the left around 10/23/2015. Following surgery, the patient had discoloration of the leg, and he has had some pain in the leg off and on since that time. He reports pain in the posterior aspect of the thigh and down below the knee posteriorly, associated with a cramping sensation at times. The pain may be brought on by walking. He does report some occasional left-sided low back pain. He is being evaluated for a possible neuropathy or a radiculopathy.  NERVE CONDUCTION STUDIES:  Nerve conduction studies were performed on both lower extremities. The distal motor latencies and motor amplitudes for the peroneal and posterior tibial nerves were within normal limits. The nerve conduction velocities for these nerves were also normal. The H reflex latencies were normal. The sensory latencies for the peroneal nerves were within normal limits.   EMG STUDIES:  EMG study was performed on the left lower extremity:  The tibialis anterior muscle reveals 2 to 4K motor units with full recruitment. No fibrillations or positive waves were seen. The peroneus tertius muscle reveals 2 to 4K motor units with full recruitment. No fibrillations or positive waves were seen. The medial gastrocnemius muscle reveals 1 to 3K motor units with full recruitment. No fibrillations or positive waves were seen. The vastus lateralis muscle reveals 2 to 4K motor units with full recruitment. No fibrillations or positive waves were seen. The iliopsoas muscle reveals 2 to 4K motor units with full recruitment. No fibrillations or positive waves were seen. The biceps femoris muscle (long head) reveals 2 to 4K motor units with full recruitment. No fibrillations or positive waves were seen. The lumbosacral paraspinal muscles were tested at 3 levels, and revealed no abnormalities of insertional activity at all 3 levels tested. There was good  relaxation.   IMPRESSION:  Nerve conduction studies done on both lower extremities were within normal limits. No evidence of a peripheral neuropathy was seen. EMG evaluation of the left lower extremity was unremarkable, no evidence of an overlying lumbosacral radiculopathy was seen.  Jill Alexanders MD 01/06/2016 4:19 PM  [facility] [address] [place], [place] [address]  Phone [phone] Fax [phone]

## 2016-01-16 DIAGNOSIS — M25562 Pain in left knee: Secondary | ICD-10-CM | POA: Diagnosis not present

## 2016-03-18 ENCOUNTER — Other Ambulatory Visit: Payer: Self-pay | Admitting: Dermatology

## 2016-03-18 DIAGNOSIS — L409 Psoriasis, unspecified: Secondary | ICD-10-CM | POA: Diagnosis not present

## 2016-03-18 DIAGNOSIS — D485 Neoplasm of uncertain behavior of skin: Secondary | ICD-10-CM | POA: Diagnosis not present

## 2016-03-18 DIAGNOSIS — L570 Actinic keratosis: Secondary | ICD-10-CM | POA: Diagnosis not present

## 2016-03-19 ENCOUNTER — Encounter (INDEPENDENT_AMBULATORY_CARE_PROVIDER_SITE_OTHER): Payer: Self-pay

## 2016-03-19 ENCOUNTER — Telehealth: Payer: Self-pay

## 2016-03-19 ENCOUNTER — Encounter: Payer: Self-pay | Admitting: Nurse Practitioner

## 2016-03-19 ENCOUNTER — Other Ambulatory Visit: Payer: Self-pay

## 2016-03-19 ENCOUNTER — Ambulatory Visit (INDEPENDENT_AMBULATORY_CARE_PROVIDER_SITE_OTHER): Payer: Medicare Other | Admitting: Nurse Practitioner

## 2016-03-19 VITALS — BP 159/76 | HR 89 | Temp 97.3°F | Ht 69.0 in | Wt 220.8 lb

## 2016-03-19 DIAGNOSIS — Z86010 Personal history of colonic polyps: Secondary | ICD-10-CM | POA: Diagnosis not present

## 2016-03-19 MED ORDER — PEG 3350-KCL-NA BICARB-NACL 420 G PO SOLR: 4000.0000 mL | ORAL | Status: DC

## 2016-03-19 NOTE — Telephone Encounter (Signed)
Pt will need updated H&P before his TCS on 05/05/16 with [facility]

## 2016-03-19 NOTE — Assessment & Plan Note (Signed)
History of multiple colon polyps. Polyps found to be simple adenoma. Recommended 5 year repeat colonoscopy, he is currently 1 year overdue. Generally asymptomatic from a GI standpoint. We will proceed with surveillance colonoscopy as previous recommended.  Proceed with colonoscopy on propofol with Dr. Oneida Alar in the near future. The risks, benefits, and alternatives have been discussed in detail with the patient. They state understanding and desire to proceed.   The patient is not on any anticoagulants, anxiolytics, chronic pain medications, or antidepressants. He has a history of alcohol abuse and cocaine abuse. Currently only admits to occasional marijuana use. Also currently drinks about a pint of liquor a week and one sitting a week. We'll arrange for the procedure to be done under propofol to promote adequate sedation.

## 2016-03-19 NOTE — Progress Notes (Signed)
Primary Care Physician:  Maggie Font, MD Primary Gastroenterologist:  Dr. Oneida Alar  Chief Complaint  Patient presents with  . Follow-up  . set up TCS    HPI:   [name] is a 60 y.o. male who presents For follow-up in set up colonoscopy. Last colonoscopy 06/30/2010. He had multiple polyps in the right colon which are found to be simple adenoma. Recommend 5 year repeat colonoscopy, he is currently 1 year overdue.  Today he states he has not had a colonoscopy since the 2011 done here. Denies abdominal pain, N/V, hematochezia, changes in bowel habits, fever, chills, unintentional weight loss. GERD symptoms well-controlled on prn Nexium. Denies chest pain, dyspnea, dizziness, lightheadedness, syncope, near syncope. Denies any other upper or lower GI symptoms.   Past Medical History  Diagnosis Date  . Alcohol abuse   . Cocaine dependence ([place])   . Dysphagia   . 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 Personal history of colonic polyps     Past Surgical History  Procedure Laterality Date  . Colonoscopy      [facility]: Multiple polyps, simple adenoma    Current Outpatient Prescriptions  Medication Sig Dispense Refill  . amLODipine (NORVASC) 5 MG tablet Take by mouth.    Marland Kitchen aspirin 81 MG tablet Take by mouth.    . esomeprazole (NEXIUM) 20 MG capsule Take 20 mg by mouth daily at 12 noon.    . fenofibrate (TRICOR) 48 MG tablet     . lisinopril-hydrochlorothiazide (PRINZIDE,ZESTORETIC) 20-12.5 MG tablet Take by mouth.    . polyethylene glycol-electrolytes (TRILYTE) 420 g solution Take 4,000 mLs by mouth as directed. 4000 mL 0   No current facility-administered medications for this visit.    Allergies as of 03/19/2016  . (No Known Allergies)    Family History  Problem Relation Age of Onset  . Colon cancer Neg Hx     Social History   Social History  . Marital Status: Married    Spouse Name: N/A  . Number of Children: N/A  . Years of Education: N/A   Occupational History  .  Not on file.   Social History Main Topics  . Smoking status: Current Every Day Smoker -- 1.00 packs/day    Types: Cigarettes  . Smokeless tobacco: Never Used  . Alcohol Use: 0.0 oz/week    0 Standard drinks or equivalent per week     Comment: once a week, one pint per sitting.  . Drug Use: Yes     Comment: Occasional marijuana  . Sexual Activity: Not on file   Other Topics Concern  . Not on file   Social History Narrative    Review of Systems: 10-point ROS negative except as per HPI.    Physical Exam: BP 159/76 mmHg  Pulse 89  Temp(Src) 97.3 F (36.3 C)  Ht 5\' 9"  (1.753 m)  Wt 220 lb 12.8 oz (100.154 kg)  BMI 32.59 kg/m2 General:   Alert and oriented. Pleasant and cooperative. Well-nourished and well-developed.  Head:  Normocephalic and atraumatic. Eyes:  Without icterus, sclera clear and conjunctiva pink.  Ears:  Normal auditory acuity. Cardiovascular:  S1, S2 present without murmurs appreciated. Extremities without clubbing or edema. Respiratory:  Clear to auscultation bilaterally. No wheezes, rales, or rhonchi. No distress.  Gastrointestinal:  +BS, soft, non-tender and non-distended. No HSM noted. No guarding or rebound. No masses appreciated.  Rectal:  Deferred  Musculoskalatal:  Symmetrical without gross deformities. Neurologic:  Alert and oriented x4;  grossly normal neurologically. Psych:  Alert and cooperative. Normal mood and affect. Heme/Lymph/Immune: No excessive bruising noted.    03/19/2016 3:33 PM   Disclaimer: This note was dictated with voice recognition software. Similar sounding words can inadvertently be transcribed and may not be corrected upon review.

## 2016-03-19 NOTE — Patient Instructions (Signed)
1. We will schedule your procedure for you. 2. Return for follow-up based on recommendations made after your procedure. 

## 2016-03-20 NOTE — Progress Notes (Signed)
cc'ed to pcp °

## 2016-03-25 NOTE — Telephone Encounter (Signed)
Noted. Will call and update.

## 2016-04-20 ENCOUNTER — Other Ambulatory Visit: Payer: Self-pay

## 2016-04-20 MED ORDER — PEG 3350-KCL-NA BICARB-NACL 420 G PO SOLR: 4000.0000 mL | ORAL | Status: DC

## 2016-04-23 DIAGNOSIS — M25561 Pain in right knee: Secondary | ICD-10-CM | POA: Diagnosis not present

## 2016-04-27 ENCOUNTER — Telehealth: Payer: Self-pay

## 2016-04-27 NOTE — Telephone Encounter (Signed)
I called and updated the med list for the pt. His colonoscopy is on 05/05/2016 with Dr. Oneida Alar.

## 2016-04-28 NOTE — Progress Notes (Signed)
REVIEWED-NO ADDITIONAL RECOMMENDATIONS. 

## 2016-04-28 NOTE — Patient Instructions (Signed)
[name]  04/28/2016     @PREFPERIOPPHARMACY @   Your procedure is scheduled on  05/05/2016   Report to [facility] at  830  A.M.  Call this number if you have problems the morning of surgery:  [phone]   Remember:  Do not eat food or drink liquids after midnight.  Take these medicines the morning of surgery with A SIP OF WATER  Norvasc, nexium, lisinopril.   Do not wear jewelry, make-up or nail polish.  Do not wear lotions, powders, or perfumes.  You may wear deoderant.  Do not shave 48 hours prior to surgery.  Men may shave face and neck.  Do not bring valuables to the hospital.  [facility] is not responsible for any belongings or valuables.  Contacts, dentures or bridgework may not be worn into surgery.  Leave your suitcase in the car.  After surgery it may be brought to your room.  For patients admitted to the hospital, discharge time will be determined by your treatment team.  Patients discharged the day of surgery will not be allowed to drive home.   Name and phone number of your driver:   family Special instructions:  Follow the diet and prep instructions given to you by Dr Nona Dell office.  Please read over the following fact sheets that you were given. Coughing and Deep Breathing, Surgical Site Infection Prevention, Anesthesia Post-op Instructions and Care and Recovery After Surgery      Colonoscopy A colonoscopy is an exam to look at the entire large intestine (colon). This exam can help find problems such as tumors, polyps, inflammation, and areas of bleeding. The exam takes about 1 hour.  LET [facility] CARE PROVIDER KNOW ABOUT:   Any allergies you have.  All medicines you are taking, including vitamins, herbs, eye drops, creams, and over-the-counter medicines.  Previous problems you or members of your family have had with the use of anesthetics.  Any blood disorders you have.  Previous surgeries you have had.  Medical conditions  you have. RISKS AND COMPLICATIONS  Generally, this is a safe procedure. However, as with any procedure, complications can occur. Possible complications include:  Bleeding.  Tearing or rupture of the colon wall.  Reaction to medicines given during the exam.  Infection (rare). BEFORE THE PROCEDURE   Ask your health care provider about changing or stopping your regular medicines.  You may be prescribed an oral bowel prep. This involves drinking a large amount of medicated liquid, starting the day before your procedure. The liquid will cause you to have multiple loose stools until your stool is almost clear or light green. This cleans out your colon in preparation for the procedure.  Do not eat or drink anything else once you have started the bowel prep, unless your health care provider tells you it is safe to do so.  Arrange for someone to drive you home after the procedure. PROCEDURE   You will be given medicine to help you relax (sedative).  You will lie on your side with your knees bent.  A long, flexible tube with a light and camera on the end (colonoscope) will be inserted through the rectum and into the colon. The camera sends video back to a computer screen as it moves through the colon. The colonoscope also releases carbon dioxide gas to inflate the colon. This helps your health care provider see the area better.  During the exam, your health care  provider may take a small tissue sample (biopsy) to be examined under a microscope if any abnormalities are found.  The exam is finished when the entire colon has been viewed. AFTER THE PROCEDURE   Do not drive for 24 hours after the exam.  You may have a small amount of blood in your stool.  You may pass moderate amounts of gas and have mild abdominal cramping or bloating. This is caused by the gas used to inflate your colon during the exam.  Ask when your test results will be ready and how you will get your results. Make sure  you get your test results.   This information is not intended to replace advice given to you by your health care provider. Make sure you discuss any questions you have with your health care provider.   Document Released: 09/25/2000 Document Revised: 07/19/2013 Document Reviewed: 06/05/2013 Elsevier Interactive Patient Education 2016 [employer] Colonoscopy, Care After Refer to this sheet in the next few weeks. These instructions provide you with information on caring for yourself after your procedure. Your health care provider may also give you more specific instructions. Your treatment has been planned according to current medical practices, but problems sometimes occur. Call your health care provider if you have any problems or questions after your procedure. WHAT TO EXPECT AFTER THE PROCEDURE  After your procedure, it is typical to have the following:  A small amount of blood in your stool.  Moderate amounts of gas and mild abdominal cramping or bloating. HOME CARE INSTRUCTIONS  Do not drive, operate machinery, or sign important documents for 24 hours.  You may shower and resume your regular physical activities, but move at a slower pace for the first 24 hours.  Take frequent rest periods for the first 24 hours.  Walk around or put a warm pack on your abdomen to help reduce abdominal cramping and bloating.  Drink enough fluids to keep your urine clear or pale yellow.  You may resume your normal diet as instructed by your health care provider. Avoid heavy or fried foods that are hard to digest.  Avoid drinking alcohol for 24 hours or as instructed by your health care provider.  Only take over-the-counter or prescription medicines as directed by your health care provider.  If a tissue sample (biopsy) was taken during your procedure:  Do not take aspirin or blood thinners for 7 days, or as instructed by your health care provider.  Do not drink alcohol for 7 days, or as  instructed by your health care provider.  Eat soft foods for the first 24 hours. SEEK MEDICAL CARE IF: You have persistent spotting of blood in your stool 2-3 days after the procedure. SEEK IMMEDIATE MEDICAL CARE IF:  You have more than a small spotting of blood in your stool.  You pass large blood clots in your stool.  Your abdomen is swollen (distended).  You have nausea or vomiting.  You have a fever.  You have increasing abdominal pain that is not relieved with medicine.   This information is not intended to replace advice given to you by your health care provider. Make sure you discuss any questions you have with your health care provider.   Document Released: 05/12/2004 Document Revised: 07/19/2013 Document Reviewed: 06/05/2013 Elsevier Interactive Patient Education 2016 Else[employer] Inc[employer] PATIENT INSTRUCTIONS POST-ANESTHESIA  IMMEDIATELY FOLLOWING SURGERY:  Do not drive or operate machinery for the first twenty four hours after surgery.  Do not make any important decisions for  twenty four hours after surgery or while taking narcotic pain medications or sedatives.  If you develop intractable nausea and vomiting or a severe headache please notify your doctor immediately.  FOLLOW-UP:  Please make an appointment with your surgeon as instructed. You do not need to follow up with anesthesia unless specifically instructed to do so.  WOUND CARE INSTRUCTIONS (if applicable):  Keep a dry clean dressing on the anesthesia/puncture wound site if there is drainage.  Once the wound has quit draining you may leave it open to air.  Generally you should leave the bandage intact for twenty four hours unless there is drainage.  If the epidural site drains for more than 36-48 hours please call the anesthesia department.  QUESTIONS?:  Please feel free to call your physician or the hospital operator if you have any questions, and they will be happy to assist you.

## 2016-04-30 ENCOUNTER — Encounter (HOSPITAL_COMMUNITY)
Admission: RE | Admit: 2016-04-30 | Discharge: 2016-04-30 | Disposition: A | Discharge status: Home / Self Care | Payer: Medicare Other | Source: Ambulatory Visit | Attending: Gastroenterology | Admitting: Gastroenterology

## 2016-05-04 ENCOUNTER — Encounter (HOSPITAL_COMMUNITY)
Admission: RE | Admit: 2016-05-04 | Discharge: 2016-05-04 | Disposition: A | Discharge status: Home / Self Care | Payer: Medicare Other | Source: Ambulatory Visit | Attending: Gastroenterology | Admitting: Gastroenterology

## 2016-05-04 ENCOUNTER — Encounter (HOSPITAL_COMMUNITY): Payer: Self-pay

## 2016-05-04 DIAGNOSIS — F1420 Cocaine dependence, uncomplicated: Secondary | ICD-10-CM | POA: Diagnosis not present

## 2016-05-04 DIAGNOSIS — F1010 Alcohol abuse, uncomplicated: Secondary | ICD-10-CM | POA: Diagnosis not present

## 2016-05-04 DIAGNOSIS — F17210 Nicotine dependence, cigarettes, uncomplicated: Secondary | ICD-10-CM | POA: Diagnosis not present

## 2016-05-04 DIAGNOSIS — Z7982 Long term (current) use of aspirin: Secondary | ICD-10-CM | POA: Diagnosis not present

## 2016-05-04 DIAGNOSIS — R1310 Dysphagia, unspecified: Secondary | ICD-10-CM | POA: Diagnosis not present

## 2016-05-04 DIAGNOSIS — D125 Benign neoplasm of sigmoid colon: Secondary | ICD-10-CM | POA: Diagnosis not present

## 2016-05-04 DIAGNOSIS — K5730 Diverticulosis of large intestine without perforation or abscess without bleeding: Secondary | ICD-10-CM | POA: Diagnosis not present

## 2016-05-04 DIAGNOSIS — K648 Other hemorrhoids: Secondary | ICD-10-CM | POA: Diagnosis not present

## 2016-05-04 DIAGNOSIS — M1990 Unspecified osteoarthritis, unspecified site: Secondary | ICD-10-CM | POA: Diagnosis not present

## 2016-05-04 DIAGNOSIS — D124 Benign neoplasm of descending colon: Secondary | ICD-10-CM | POA: Diagnosis not present

## 2016-05-04 DIAGNOSIS — Z1211 Encounter for screening for malignant neoplasm of colon: Secondary | ICD-10-CM | POA: Diagnosis not present

## 2016-05-04 DIAGNOSIS — Z86010 Personal history of colonic polyps: Secondary | ICD-10-CM | POA: Diagnosis not present

## 2016-05-04 DIAGNOSIS — Z79899 Other long term (current) drug therapy: Secondary | ICD-10-CM | POA: Diagnosis not present

## 2016-05-04 HISTORY — DX: Unspecified osteoarthritis, unspecified site: M19.90

## 2016-05-04 HISTORY — DX: Inflammatory liver disease, unspecified: K75.9

## 2016-05-04 LAB — CBC WITH DIFFERENTIAL/PLATELET
Basophils Absolute: 0 10*3/uL (ref 0.0–0.1)
Basophils Relative: 0 %
Eosinophils Absolute: 0.1 10*3/uL (ref 0.0–0.7)
Eosinophils Relative: 1 %
HCT: 46.9 % (ref 39.0–52.0)
Hemoglobin: 16.3 g/dL (ref 13.0–17.0)
Lymphocytes Relative: 25 %
Lymphs Abs: 2.6 10*3/uL (ref 0.7–4.0)
MCH: 30.5 pg (ref 26.0–34.0)
MCHC: 34.8 g/dL (ref 30.0–36.0)
MCV: 87.7 fL (ref 78.0–100.0)
Monocytes Absolute: 0.9 10*3/uL (ref 0.1–1.0)
Monocytes Relative: 8 %
Neutro Abs: 6.8 10*3/uL (ref 1.7–7.7)
Neutrophils Relative %: 66 %
Platelets: 293 10*3/uL (ref 150–400)
RBC: 5.35 MIL/uL (ref 4.22–5.81)
RDW: 13.2 % (ref 11.5–15.5)
WBC: 10.5 10*3/uL (ref 4.0–10.5)

## 2016-05-04 LAB — BASIC METABOLIC PANEL
Anion gap: 7 (ref 5–15)
BUN: 13 mg/dL (ref 6–20)
CO2: 24 mmol/L (ref 22–32)
Calcium: 9.2 mg/dL (ref 8.9–10.3)
Chloride: 106 mmol/L (ref 101–111)
Creatinine, Ser: 0.86 mg/dL (ref 0.61–1.24)
GFR calc Af Amer: >60 mL/min (ref >60)
GFR calc non Af Amer: >60 mL/min (ref >60)
Glucose, Bld: 97 mg/dL (ref 65–99)
Potassium: 3.7 mmol/L (ref 3.5–5.1)
Sodium: 137 mmol/L (ref 135–145)

## 2016-05-05 ENCOUNTER — Ambulatory Visit (HOSPITAL_COMMUNITY)
Admission: RE | Admit: 2016-05-05 | Discharge: 2016-05-05 | Disposition: A | Discharge status: Home / Self Care | Payer: Medicare Other | Source: Ambulatory Visit | Attending: Gastroenterology | Admitting: Gastroenterology

## 2016-05-05 ENCOUNTER — Ambulatory Visit (HOSPITAL_COMMUNITY): Payer: Medicare Other | Admitting: Anesthesiology

## 2016-05-05 ENCOUNTER — Encounter (HOSPITAL_COMMUNITY)
Admission: RE | Disposition: A | Discharge status: Home / Self Care | Payer: Self-pay | Source: Ambulatory Visit | Attending: Gastroenterology

## 2016-05-05 DIAGNOSIS — Z1211 Encounter for screening for malignant neoplasm of colon: Secondary | ICD-10-CM | POA: Diagnosis not present

## 2016-05-05 DIAGNOSIS — F1010 Alcohol abuse, uncomplicated: Secondary | ICD-10-CM | POA: Insufficient documentation

## 2016-05-05 DIAGNOSIS — D125 Benign neoplasm of sigmoid colon: Secondary | ICD-10-CM | POA: Diagnosis not present

## 2016-05-05 DIAGNOSIS — K5730 Diverticulosis of large intestine without perforation or abscess without bleeding: Secondary | ICD-10-CM | POA: Insufficient documentation

## 2016-05-05 DIAGNOSIS — D124 Benign neoplasm of descending colon: Secondary | ICD-10-CM | POA: Diagnosis not present

## 2016-05-05 DIAGNOSIS — F17210 Nicotine dependence, cigarettes, uncomplicated: Secondary | ICD-10-CM | POA: Insufficient documentation

## 2016-05-05 DIAGNOSIS — Z79899 Other long term (current) drug therapy: Secondary | ICD-10-CM | POA: Diagnosis not present

## 2016-05-05 DIAGNOSIS — D123 Benign neoplasm of transverse colon: Secondary | ICD-10-CM | POA: Diagnosis not present

## 2016-05-05 DIAGNOSIS — K635 Polyp of colon: Secondary | ICD-10-CM | POA: Diagnosis not present

## 2016-05-05 DIAGNOSIS — Z86010 Personal history of colonic polyps: Secondary | ICD-10-CM | POA: Diagnosis not present

## 2016-05-05 DIAGNOSIS — M1990 Unspecified osteoarthritis, unspecified site: Secondary | ICD-10-CM | POA: Diagnosis not present

## 2016-05-05 DIAGNOSIS — F1420 Cocaine dependence, uncomplicated: Secondary | ICD-10-CM | POA: Insufficient documentation

## 2016-05-05 DIAGNOSIS — Z7982 Long term (current) use of aspirin: Secondary | ICD-10-CM | POA: Insufficient documentation

## 2016-05-05 DIAGNOSIS — K648 Other hemorrhoids: Secondary | ICD-10-CM | POA: Diagnosis not present

## 2016-05-05 DIAGNOSIS — R1310 Dysphagia, unspecified: Secondary | ICD-10-CM | POA: Insufficient documentation

## 2016-05-05 HISTORY — PX: BIOPSY: SHX5522

## 2016-05-05 HISTORY — PX: COLONOSCOPY WITH PROPOFOL: SHX5780

## 2016-05-05 HISTORY — PX: POLYPECTOMY: SHX5525

## 2016-05-05 SURGERY — COLONOSCOPY WITH PROPOFOL
Anesthesia: Monitor Anesthesia Care

## 2016-05-05 MED ORDER — LACTATED RINGERS IV SOLN
INTRAVENOUS | Status: DC
  Administered 2016-05-05 (×2): via INTRAVENOUS

## 2016-05-05 MED ORDER — MIDAZOLAM HCL 2 MG/2ML IJ SOLN
1.0000 mg | INTRAMUSCULAR | Status: DC | PRN
  Administered 2016-05-05: 2 mg via INTRAVENOUS

## 2016-05-05 MED ORDER — FENTANYL CITRATE (PF) 100 MCG/2ML IJ SOLN
25.0000 ug | INTRAMUSCULAR | Status: AC | PRN
  Administered 2016-05-05 (×2): 25 ug via INTRAVENOUS

## 2016-05-05 MED ORDER — GLYCOPYRROLATE 0.2 MG/ML IJ SOLN
0.2000 mg | Freq: Once | INTRAMUSCULAR | Status: AC | PRN
  Administered 2016-05-05: 0.2 mg via INTRAVENOUS

## 2016-05-05 MED ORDER — FENTANYL CITRATE (PF) 100 MCG/2ML IJ SOLN
INTRAMUSCULAR | Status: AC
  Filled 2016-05-05: qty 2

## 2016-05-05 MED ORDER — MIDAZOLAM HCL 2 MG/2ML IJ SOLN
INTRAMUSCULAR | Status: AC
  Filled 2016-05-05: qty 2

## 2016-05-05 MED ORDER — ESOMEPRAZOLE MAGNESIUM 40 MG PO CPDR: DELAYED_RELEASE_CAPSULE | ORAL | 3 refills | Status: DC

## 2016-05-05 MED ORDER — ONDANSETRON HCL 4 MG/2ML IJ SOLN
4.0000 mg | Freq: Once | INTRAMUSCULAR | Status: AC
  Administered 2016-05-05: 4 mg via INTRAVENOUS

## 2016-05-05 MED ORDER — ONDANSETRON HCL 4 MG/2ML IJ SOLN: 4.0000 mg | Freq: Once | INTRAMUSCULAR | Status: DC | PRN

## 2016-05-05 MED ORDER — PROPOFOL 500 MG/50ML IV EMUL
INTRAVENOUS | Status: DC | PRN
  Administered 2016-05-05: 50 ug/kg/min via INTRAVENOUS
  Administered 2016-05-05: 11:00:00 via INTRAVENOUS

## 2016-05-05 MED ORDER — FENTANYL CITRATE (PF) 100 MCG/2ML IJ SOLN: 25.0000 ug | INTRAMUSCULAR | Status: DC | PRN

## 2016-05-05 MED ORDER — ESOMEPRAZOLE MAGNESIUM 20 MG PO CPDR: DELAYED_RELEASE_CAPSULE | ORAL | 3 refills | Status: DC

## 2016-05-05 MED ORDER — PROPOFOL 10 MG/ML IV BOLUS
INTRAVENOUS | Status: AC
  Filled 2016-05-05: qty 40

## 2016-05-05 MED ORDER — GLYCOPYRROLATE 0.2 MG/ML IJ SOLN
INTRAMUSCULAR | Status: AC
  Filled 2016-05-05: qty 1

## 2016-05-05 MED ORDER — ONDANSETRON HCL 4 MG/2ML IJ SOLN
INTRAMUSCULAR | Status: AC
  Filled 2016-05-05: qty 2

## 2016-05-05 MED ORDER — LIDOCAINE HCL (CARDIAC) 20 MG/ML IV SOLN
INTRAVENOUS | Status: DC | PRN
  Administered 2016-05-05: 10 mg via INTRAVENOUS

## 2016-05-05 NOTE — Op Note (Signed)
[facility] [place] Patient Name: [name] Procedure Date: 05/05/2016 10:49 AM MRN: [id number] Date of Birth: Apr 28, 1956 Attending MD: Barney Drain , MD CSN: [id number] Age: 60 Admit Type: Outpatient Procedure:                Colonoscopy WITH COLD FORCEPS AND SNARE CAUTERY                            POLYPECTOMY Indications:              High risk colon cancer surveillance: Personal                            history of colonic polyps Providers:                Barney Drain, MD, Janeece Riggers, RN, Randa Spike,                            Technician Referring MD:             Barrie Folk. Hill MD, MD Medicines:                Propofol per Anesthesia Complications:            No immediate complications. Estimated Blood Loss:     Estimated blood loss was minimal. Procedure:                Pre-Anesthesia Assessment:                           - Prior to the procedure, a History and Physical                            was performed, and patient medications and                            allergies were reviewed. The patient's tolerance of                            previous anesthesia was also reviewed. The risks                            and benefits of the procedure and the sedation                            options and risks were discussed with the patient.                            All questions were answered, and informed consent                            was obtained. Prior Anticoagulants: The patient has                            taken no previous anticoagulant or antiplatelet  agents. ASA Grade Assessment: II - A patient with                            mild systemic disease. After reviewing the risks                            and benefits, the patient was deemed in                            satisfactory condition to undergo the procedure.                           After obtaining informed consent, the colonoscope                            was passed  under direct vision. Throughout the                            procedure, the patient's blood pressure, pulse, and                            oxygen saturations were monitored continuously. The                            [id number] scope was introduced through                            the anus and advanced to the the cecum, identified                            by appendiceal orifice and ileocecal valve. The                            ileocecal valve, appendiceal orifice, and rectum                            were photographed. The colonoscopy was somewhat                            difficult due to a tortuous colon. Successful                            completion of the procedure was aided by using                            manual pressure, straightening and shortening the                            scope to obtain bowel loop reduction and COLOWRAP.                            The patient tolerated the procedure well. The  quality of the bowel preparation was excellent. Scope In: 11:07:42 AM Scope Out: 11:35:07 AM Scope Withdrawal Time: 0 hours 25 minutes 4 seconds  Total Procedure Duration: 0 hours 27 minutes 25 seconds  Findings:      Six sessile polyps were found in the sigmoid colon(4-CF) and transverse       colon(2). The polyps were 2 to 5 mm in size. These polyps were removed       with a cold biopsy forceps. Resection and retrieval were complete.      Four sessile polyps were found in the descending colon(3) and transverse       colon(1). The polyps were 5 to 7 mm in size. These polyps were removed       with a hot snare. Resection and retrieval were complete.      A few small-mouthed diverticula were found in the sigmoid colon.      Non-bleeding internal hemorrhoids were found. The hemorrhoids were       moderate. Impression:               - Six 2 to 5 mm polyps in the sigmoid colon and in                            the transverse colon,  removed with a cold biopsy                            forceps. Resected and retrieved.                           - Four 5 to 7 mm polyps in the descending colon and                            in the transverse colon, removed with a hot snare.                            Resected and retrieved.                           - MILD Diverticulosis in the sigmoid colon.                           - Non-bleeding internal hemorrhoids. Moderate Sedation:      Per Anesthesia Care Recommendation:           - High fiber diet and low fat diet.                           - Continue present medications.                           - Await pathology results.                           - Repeat colonoscopy in 3 - 5 years for                            surveillance.                           -  NEXIUM 40 MG 30 MINS PRIOR TO FIRST MEAL TO                            PREVENT ULCERS AND ESOPHAGEAL STRICTURE.                           - Patient has a contact number available for                            emergencies. The signs and symptoms of potential                            delayed complications were discussed with the                            patient. Return to normal activities tomorrow.                            Written discharge instructions were provided to the                            patient. Procedure Code(s):        --- Professional ---                           [phone], Colonoscopy, flexible; with removal of                            tumor(s), polyp(s), or other lesion(s) by snare                            technique                           45380, 76, Colonoscopy, flexible; with biopsy,                            single or multiple Diagnosis Code(s):        --- Professional ---                           Z86.010, Personal history of colonic polyps                           D12.5, Benign neoplasm of sigmoid colon                           D12.4, Benign neoplasm of descending colon                            D12.3, Benign neoplasm of transverse colon (hepatic                            flexure or splenic flexure)  K64.8, Other hemorrhoids                           K57.30, Diverticulosis of large intestine without                            perforation or abscess without bleeding CPT copyright 2016 [employer]. All rights reserved. The codes documented in this report are preliminary and upon coder review may  be revised to meet current compliance requirements. Barney Drain, MD Barney Drain, MD 05/05/2016 10:06:13 PM This report has been signed electronically. Number of Addenda: 0

## 2016-05-05 NOTE — Anesthesia Preprocedure Evaluation (Signed)
Anesthesia Evaluation  Patient identified by MRN, date of birth, ID band Patient awake    Reviewed: Allergy & Precautions, NPO status , Patient's Chart, lab work & pertinent test results  Airway Mallampati: II  TM Distance: >3 FB     Dental  (+) Teeth Intact   Pulmonary Current Smoker,    breath sounds clear to auscultation       Cardiovascular negative cardio ROS   Rhythm:Regular Rate:Normal     Neuro/Psych    GI/Hepatic negative GI ROS, (+)     substance abuse  alcohol use and cocaine use, Hepatitis -  Endo/Other    Renal/GU      Musculoskeletal   Abdominal   Peds  Hematology   Anesthesia Other Findings   Reproductive/Obstetrics                             Anesthesia Physical Anesthesia Plan  ASA: III  Anesthesia Plan: MAC   Post-op Pain Management:    Induction: Intravenous  Airway Management Planned: Simple Face Mask  Additional Equipment:   Intra-op Plan:   Post-operative Plan:   Informed Consent: I have reviewed the patients History and Physical, chart, labs and discussed the procedure including the risks, benefits and alternatives for the proposed anesthesia with the patient or authorized representative who has indicated his/her understanding and acceptance.     Plan Discussed with:   Anesthesia Plan Comments:         Anesthesia Quick Evaluation

## 2016-05-05 NOTE — Anesthesia Postprocedure Evaluation (Signed)
Anesthesia Post Note  Patient: [name]  Procedure(s) Performed: Procedure(s) (LRB): COLONOSCOPY WITH PROPOFOL (N/A) BIOPSY POLYPECTOMY  Patient location during evaluation: PACU Anesthesia Type: MAC Level of consciousness: awake and alert and oriented Pain management: pain level controlled Vital Signs Assessment: post-procedure vital signs reviewed and stable Respiratory status: spontaneous breathing Cardiovascular status: stable Postop Assessment: no signs of nausea or vomiting Anesthetic complications: no    Last Vitals:  Vitals:   05/05/16 1031 05/05/16 1046  BP: (!) 106/51 (!) 111/58  Pulse:    Resp:  (!) 21  Temp:      Last Pain:  Vitals:   05/05/16 0918  TempSrc: Oral  PainSc: 2                  Sindee Stucker

## 2016-05-05 NOTE — H&P (Signed)
  Primary Care Physician:  Maggie Font, MD Primary Gastroenterologist:  Dr. Oneida Alar  Pre-Procedure History & Physical: HPI:  [name] is a 60 y.o. male here for  PERSONAL HISTORY OF POLYPS.  Past Medical History:  Diagnosis Date  . Alcohol abuse   . Arthritis   . 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 Cocaine dependence ([place])   . Dysphagia   . Hepatitis   . Personal history of colonic polyps     Past Surgical History:  Procedure Laterality Date  . COLONOSCOPY     SLF: Multiple polyps, simple adenoma  . KNEE ARTHROPLASTY Left   . KNEE ARTHROSCOPY Right   . TRIGGER FINGER RELEASE Left 2010    Prior to Admission medications   Medication Sig Start Date End Date Taking? Authorizing Provider  amLODipine (NORVASC) 5 MG tablet Take by mouth. 12/17/11  Yes Historical Provider, MD  aspirin 81 MG tablet Take by mouth. 01/15/10  Yes Historical Provider, MD  esomeprazole (NEXIUM) 20 MG capsule Take 20 mg by mouth daily at 12 noon. As needed   Yes Historical Provider, MD  lisinopril-hydrochlorothiazide (PRINZIDE,ZESTORETIC) 20-12.5 MG tablet Take by mouth. 12/17/11  Yes Historical Provider, MD  polyethylene glycol-electrolytes (TRILYTE) 420 g solution Take 4,000 mLs by mouth as directed. 04/20/16  Yes Danie Binder, MD  fenofibrate (TRICOR) 48 MG tablet  04/19/14   Historical Provider, MD    Allergies as of 03/19/2016  . (No Known Allergies)    Family History  Problem Relation Age of Onset  . Colon cancer Neg Hx     Social History   Social History  . Marital status: Married    Spouse name: N/A  . Number of children: N/A  . Years of education: N/A   Occupational History  . Not on file.   Social History Main Topics  . Smoking status: Current Every Day Smoker    Packs/day: 1.00    Types: Cigarettes  . Smokeless tobacco: Never Used  . Alcohol use 0.0 oz/week     Comment: once a week, one pint per sitting.  . Drug use:     Types: Cocaine     Comment: Occasional marijuana  .  Sexual activity: Not on file   Other Topics Concern  . Not on file   Social History Narrative  . No narrative on file    Review of Systems: See HPI, otherwise negative ROS   Physical Exam: BP (!) 111/58   Pulse 74   Temp 98 F (36.7 C) (Oral)   Resp 18   SpO2 98%  General:   Alert,  pleasant and cooperative in NAD Head:  Normocephalic and atraumatic. Neck:  Supple; Lungs:  Clear throughout to auscultation.    Heart:  Regular rate and rhythm. Abdomen:  Soft, nontender and nondistended. Normal bowel sounds, without guarding, and without rebound.   Neurologic:  Alert and  oriented x4;  grossly normal neurologically.  Impression/Plan:    PERSONAL HISTORY OF POLYPS.  PLAN: 1. TCS TODAY

## 2016-05-05 NOTE — Transfer of Care (Signed)
Immediate Anesthesia Transfer of Care Note  Patient: [name]  Procedure(s) Performed: Procedure(s) with comments: COLONOSCOPY WITH PROPOFOL (N/A) - 1000 BIOPSY - transverse colon polyp POLYPECTOMY - transverse colon, descending colon and sigmoid colon polyps  Patient Location: PACU  Anesthesia Type:MAC  Level of Consciousness: awake  Airway & Oxygen Therapy: Patient Spontanous Breathing and Patient connected to nasal cannula oxygen  Post-op Assessment: Report given to RN  Post vital signs: Reviewed  Last Vitals:  Vitals:   05/05/16 1031 05/05/16 1046  BP: (!) 106/51 (!) 111/58  Pulse:    Resp:  (!) 21  Temp:      Last Pain:  Vitals:   05/05/16 0918  TempSrc: Oral  PainSc: 2       Patients Stated Pain Goal: 6 ([id number])  Complications: No apparent anesthesia complications

## 2016-05-05 NOTE — Discharge Instructions (Signed)
You have MODERATE internal hemorrhoids and A MILD CASE OF diverticulosis IN YOUR LEFT COLON. YOU HAD TEN SMALL POLYPS REMOVED.    HOLD ASPIRIN. RE-START JUL 31.  CONTINUE NEXIUM. TAKE 30 MINUTES BEFORE YOUR FIRST MEAL TO PREVENT ULCERS AND ACID REFLUX STRICTURES.  CONTINUE YOUR WEIGHT LOSS EFFORTS. LOSE 20 LBS. YOUR BODY MASS INDEX IS OVER 30 WHICH MEANS YOU ARE OBESE. OBESITY IS ASSOCIATED WITH AN INCREASE FOR ALL CANCERS, INCLUDING ESOPHAGEAL AND COLON CANCER.   DRINK WATER TO KEEP YOUR URINE LIGHT YELLOW.  FOLLOW A HIGH FIBER/LOW FAT DIET. AVOID ITEMS THAT CAUSE BLOATING. See info below.  YOUR BIOPSY RESULTS WILL BE AVAILABLE IN MY CHART AFTER JUL 28 AND MY OFFICE WILL CONTACT YOU IN 10-14 DAYS WITH YOUR RESULTS.   Next colonoscopy in 3-5 years.  Colonoscopy Care After Read the instructions outlined below and refer to this sheet in the next week. These discharge instructions provide you with general information on caring for yourself after you leave the hospital. While your treatment has been planned according to the most current medical practices available, unavoidable complications occasionally occur. If you have any problems or questions after discharge, call DR. FIELDS, [phone].  ACTIVITY  You may resume your regular activity, but move at a slower pace for the next 24 hours.   Take frequent rest periods for the next 24 hours.   Walking will help get rid of the air and reduce the bloated feeling in your belly (abdomen).   No driving for 24 hours (because of the medicine (anesthesia) used during the test).   You may shower.   Do not sign any important legal documents or operate any machinery for 24 hours (because of the anesthesia used during the test).    NUTRITION  Drink plenty of fluids.   You may resume your normal diet as instructed by your doctor.   Begin with a light meal and progress to your normal diet. Heavy or fried foods are harder to digest and may  make you feel sick to your stomach (nauseated).   Avoid alcoholic beverages for 24 hours or as instructed.    MEDICATIONS  You may resume your normal medications.   WHAT YOU CAN EXPECT TODAY  Some feelings of bloating in the abdomen.   Passage of more gas than usual.   Spotting of blood in your stool or on the toilet paper  .  IF YOU HAD POLYPS REMOVED DURING THE COLONOSCOPY:  Eat a soft diet IF YOU HAVE NAUSEA, BLOATING, ABDOMINAL PAIN, OR VOMITING.    FINDING OUT THE RESULTS OF YOUR TEST Not all test results are available during your visit. DR. Oneida Alar WILL CALL YOU WITHIN 14 DAYS OF YOUR PROCEDUE WITH YOUR RESULTS. Do not assume everything is normal if you have not heard from DR. FIELDS, CALL HER OFFICE AT [phone].  SEEK IMMEDIATE MEDICAL ATTENTION AND CALL THE OFFICE: [phone] IF:  You have more than a spotting of blood in your stool.   Your belly is swollen (abdominal distention).   You are nauseated or vomiting.   You have a temperature over 101F.   You have abdominal pain or discomfort that is severe or gets worse throughout the day.  High-Fiber Diet A high-fiber diet changes your normal diet to include more whole grains, legumes, fruits, and vegetables. Changes in the diet involve replacing refined carbohydrates with unrefined foods. The calorie level of the diet is essentially unchanged. The Dietary Reference Intake (recommended amount) for adult males is  38 grams per day. For adult females, it is 25 grams per day. Pregnant and lactating women should consume 28 grams of fiber per day. Fiber is the intact part of a plant that is not broken down during digestion. Functional fiber is fiber that has been isolated from the plant to provide a beneficial effect in the body. PURPOSE  Increase stool bulk.   Ease and regulate bowel movements.   Lower cholesterol.   REDUCE RISK OF COLON CANCER  INDICATIONS THAT YOU NEED MORE FIBER  Constipation and  hemorrhoids.   Uncomplicated diverticulosis (intestine condition) and irritable bowel syndrome.   Weight management.   As a protective measure against hardening of the arteries (atherosclerosis), diabetes, and cancer.   GUIDELINES FOR INCREASING FIBER IN THE DIET  Start adding fiber to the diet slowly. A gradual increase of about 5 more grams (2 slices of whole-wheat bread, 2 servings of most fruits or vegetables, or 1 bowl of high-fiber cereal) per day is best. Too rapid an increase in fiber may result in constipation, flatulence, and bloating.   Drink enough water and fluids to keep your urine clear or pale yellow. Water, juice, or caffeine-free drinks are recommended. Not drinking enough fluid may cause constipation.   Eat a variety of high-fiber foods rather than one type of fiber.   Try to increase your intake of fiber through using high-fiber foods rather than fiber pills or supplements that contain small amounts of fiber.   The goal is to change the types of food eaten. Do not supplement your present diet with high-fiber foods, but replace foods in your present diet.   INCLUDE A VARIETY OF FIBER SOURCES  Replace refined and processed grains with whole grains, canned fruits with fresh fruits, and incorporate other fiber sources. White rice, white breads, and most bakery goods contain little or no fiber.   Brown whole-grain rice, buckwheat oats, and many fruits and vegetables are all good sources of fiber. These include: broccoli, Brussels sprouts, cabbage, cauliflower, beets, sweet potatoes, white potatoes (skin on), carrots, tomatoes, eggplant, squash, berries, fresh fruits, and dried fruits.   Cereals appear to be the richest source of fiber. Cereal fiber is found in whole grains and bran. Bran is the fiber-rich outer coat of cereal grain, which is largely removed in refining. In whole-grain cereals, the bran remains. In breakfast cereals, the largest amount of fiber is found in  those with "bran" in their names. The fiber content is sometimes indicated on the label.   You may need to include additional fruits and vegetables each day.   In baking, for 1 cup white flour, you may use the following substitutions:   1 cup whole-wheat flour minus 2 tablespoons.   1/2 cup white flour plus 1/2 cup whole-wheat flour.    Low-Fat Diet BREADS, CEREALS, PASTA, RICE, DRIED PEAS, AND BEANS These products are high in carbohydrates and most are low in fat. Therefore, they can be increased in the diet as substitutes for fatty foods. They too, however, contain calories and should not be eaten in excess. Cereals can be eaten for snacks as well as for breakfast.   FRUITS AND VEGETABLES It is good to eat fruits and vegetables. Besides being sources of fiber, both are rich in vitamins and some minerals. They help you get the daily allowances of these nutrients. Fruits and vegetables can be used for snacks and desserts.  MEATS Limit lean meat, chicken, [place], and fish to no more than 6 ounces per  day. Beef, Pork, and Lamb Use lean cuts of beef, pork, and lamb. Lean cuts include:  Extra-lean ground beef.  Arm roast.  Sirloin tip.  Center-cut ham.  Round steak.  Loin chops.  Rump roast.  Tenderloin.  Trim all fat off the outside of meats before cooking. It is not necessary to severely decrease the intake of red meat, but lean choices should be made. Lean meat is rich in protein and contains a highly absorbable form of iron. Premenopausal women, in particular, should avoid reducing lean red meat because this could increase the risk for low red blood cells (iron-deficiency anemia).  Chicken and [place] These are good sources of protein. The fat of poultry can be reduced by removing the skin and underlying fat layers before cooking. Chicken and [place] can be substituted for lean red meat in the diet. Poultry should not be fried or covered with high-fat sauces. Fish and Shellfish Fish  is a good source of protein. Shellfish contain cholesterol, but they usually are low in saturated fatty acids. The preparation of fish is important. Like chicken and [place], they should not be fried or covered with high-fat sauces. EGGS Egg whites contain no fat or cholesterol. They can be eaten often. Try 1 to 2 egg whites instead of whole eggs in recipes or use egg substitutes that do not contain yolk. MILK AND DAIRY PRODUCTS Use skim or 1% milk instead of 2% or whole milk. Decrease whole milk, natural, and processed cheeses. Use nonfat or low-fat (2%) cottage cheese or low-fat cheeses made from vegetable oils. Choose nonfat or low-fat (1 to 2%) yogurt. Experiment with evaporated skim milk in recipes that call for heavy cream. Substitute low-fat yogurt or low-fat cottage cheese for sour cream in dips and salad dressings. Have at least 2 servings of low-fat dairy products, such as 2 glasses of skim (or 1%) milk each day to help get your daily calcium intake. FATS AND OILS Reduce the total intake of fats, especially saturated fat. Butterfat, lard, and beef fats are high in saturated fat and cholesterol. These should be avoided as much as possible. Vegetable fats do not contain cholesterol, but certain vegetable fats, such as coconut oil, palm oil, and palm kernel oil are very high in saturated fats. These should be limited. These fats are often used in bakery goods, processed foods, popcorn, oils, and nondairy creamers. Vegetable shortenings and some peanut butters contain hydrogenated oils, which are also saturated fats. Read the labels on these foods and check for saturated vegetable oils. Unsaturated vegetable oils and fats do not raise blood cholesterol. However, they should be limited because they are fats and are high in calories. Total fat should still be limited to 30% of your daily caloric intake. Desirable liquid vegetable oils are corn oil, cottonseed oil, olive oil, canola oil, safflower oil,  soybean oil, and sunflower oil. Peanut oil is not as good, but small amounts are acceptable. Buy a heart-healthy tub margarine that has no partially hydrogenated oils in the ingredients. Mayonnaise and salad dressings often are made from unsaturated fats, but they should also be limited because of their high calorie and fat content. Seeds, nuts, peanut butter, olives, and avocados are high in fat, but the fat is mainly the unsaturated type. These foods should be limited mainly to avoid excess calories and fat. OTHER EATING TIPS Snacks  Most sweets should be limited as snacks. They tend to be rich in calories and fats, and their caloric content outweighs their nutritional  value. Some good choices in snacks are graham crackers, melba toast, soda crackers, bagels (no egg), English muffins, fruits, and vegetables. These snacks are preferable to snack crackers, [place] fries, TORTILLA CHIPS, and POTATO chips. Popcorn should be air-popped or cooked in small amounts of liquid vegetable oil. Desserts Eat fruit, low-fat yogurt, and fruit ices instead of pastries, cake, and cookies. Sherbet, angel food cake, gelatin dessert, frozen low-fat yogurt, or other frozen products that do not contain saturated fat (pure fruit juice bars, frozen ice pops) are also acceptable.  COOKING METHODS Choose those methods that use little or no fat. They include: Poaching.  Braising.  Steaming.  Grilling.  Baking.  Stir-frying.  Broiling.  Microwaving.  Foods can be cooked in a nonstick pan without added fat, or use a nonfat cooking spray in regular cookware. Limit fried foods and avoid frying in saturated fat. Add moisture to lean meats by using water, broth, cooking wines, and other nonfat or low-fat sauces along with the cooking methods mentioned above. Soups and stews should be chilled after cooking. The fat that forms on top after a few hours in the refrigerator should be skimmed off. When preparing meals, avoid using  excess salt. Salt can contribute to raising blood pressure in some people.  EATING AWAY FROM HOME Order entres, potatoes, and vegetables without sauces or butter. When meat exceeds the size of a deck of cards (3 to 4 ounces), the rest can be taken home for another meal. Choose vegetable or fruit salads and ask for low-calorie salad dressings to be served on the side. Use dressings sparingly. Limit high-fat toppings, such as bacon, crumbled eggs, cheese, sunflower seeds, and olives. Ask for heart-healthy tub margarine instead of butter.   Polyps, Colon  A polyp is extra tissue that grows inside your body. Colon polyps grow in the large intestine. The large intestine, also called the colon, is part of your digestive system. It is a long, hollow tube at the end of your digestive tract where your body makes and stores stool. Most polyps are not dangerous. They are benign. This means they are not cancerous. But over time, some types of polyps can turn into cancer. Polyps that are smaller than a pea are usually not harmful. But larger polyps could someday become or may already be cancerous. To be safe, doctors remove all polyps and test them.   PREVENTION There is not one sure way to prevent polyps. You might be able to lower your risk of getting them if you:  Eat more fruits and vegetables and less fatty food.   Do not smoke.   Avoid alcohol.   Exercise every day.   Lose weight if you are overweight.   Eating more calcium and folate can also lower your risk of getting polyps. Some foods that are rich in calcium are milk, cheese, and broccoli. Some foods that are rich in folate are chickpeas, kidney beans, and spinach.    Diverticulosis Diverticulosis is a common condition that develops when small pouches (diverticula) form in the wall of the colon. The risk of diverticulosis increases with age. It happens more often in people who eat a low-fiber diet. Most individuals with diverticulosis have  no symptoms. Those individuals with symptoms usually experience belly (abdominal) pain, constipation, or loose stools (diarrhea).  HOME CARE INSTRUCTIONS  Increase the amount of fiber in your diet as directed by your caregiver or dietician. This may reduce symptoms of diverticulosis.   Drink at least 6 to 8  glasses of water each day to prevent constipation.   Try not to strain when you have a bowel movement.   Avoiding nuts and seeds to prevent complications is NOT NECESSARY.    FOODS HAVING HIGH FIBER CONTENT INCLUDE:  Fruits. Apple, peach, pear, tangerine, raisins, prunes.   Vegetables. Brussels sprouts, asparagus, broccoli, cabbage, carrot, cauliflower, romaine lettuce, spinach, summer squash, tomato, winter squash, zucchini.   Starchy Vegetables. Baked beans, kidney beans, lima beans, split peas, lentils, potatoes (with skin).   Grains. Whole wheat bread, brown rice, bran flake cereal, plain oatmeal, white rice, shredded wheat, bran muffins.   SEEK IMMEDIATE MEDICAL CARE IF:  You develop increasing pain or severe bloating.   You have an oral temperature above 101F.   You develop vomiting or bowel movements that are bloody or black.   Hemorrhoids Hemorrhoids are dilated (enlarged) veins around the rectum. Sometimes clots will form in the veins. This makes them swollen and painful. These are called thrombosed hemorrhoids. Causes of hemorrhoids include:  Constipation.   Straining to have a bowel movement.   HEAVY LIFTING  HOME CARE INSTRUCTIONS  Eat a well balanced diet and drink 6 to 8 glasses of water every day to avoid constipation. You may also use a bulk laxative.   Avoid straining to have bowel movements.   Keep anal area dry and clean.   Do not use a donut shaped pillow or sit on the toilet for long periods. This increases blood pooling and pain.   Move your bowels when your body has the urge; this will require less straining and will decrease pain and  pressure.     General Anesthesia, Adult, Care After Refer to this sheet in the next few weeks. These instructions provide you with information on caring for yourself after your procedure. Your health care provider may also give you more specific instructions. Your treatment has been planned according to current medical practices, but problems sometimes occur. Call your health care provider if you have any problems or questions after your procedure. WHAT TO EXPECT AFTER THE PROCEDURE After the procedure, it is typical to experience:  Sleepiness.  Nausea and vomiting. HOME CARE INSTRUCTIONS  For the first 24 hours after general anesthesia:  Have a responsible person with you.  Do not drive a car. If you are alone, do not take public transportation.  Do not drink alcohol.  Do not take medicine that has not been prescribed by your health care provider.  Do not sign important papers or make important decisions.  You may resume a normal diet and activities as directed by your health care provider.  Change bandages (dressings) as directed.  If you have questions or problems that seem related to general anesthesia, call the hospital and ask for the anesthetist or anesthesiologist on call. SEEK MEDICAL CARE IF:  You have nausea and vomiting that continue the day after anesthesia.  You develop a rash. SEEK IMMEDIATE MEDICAL CARE IF:   You have difficulty breathing.  You have chest pain.  You have any allergic problems.   This information is not intended to replace advice given to you by your health care provider. Make sure you discuss any questions you have with your health care provider.   Document Released: 01/04/2001 Document Revised: 10/19/2014 Document Reviewed: 01/27/2012 Elsevier Interactive Patient Education [employer].

## 2016-05-10 ENCOUNTER — Telehealth: Payer: Self-pay | Admitting: Gastroenterology

## 2016-05-10 NOTE — Telephone Encounter (Signed)
  Please call pt. 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   RE-START ASA JUL 31.  CONTINUE NEXIUM. TAKE 30 MINUTES BEFORE YOUR FIRST MEAL TO PREVENT ULCERS AND ACID REFLUX STRICTURES.  CONTINUE YOUR WEIGHT LOSS EFFORTS. LOSE 20 LBS.   DRINK WATER TO KEEP YOUR URINE LIGHT YELLOW.  FOLLOW A HIGH FIBER/LOW FAT DIET. AVOID ITEMS THAT CAUSE BLOATING.   Next colonoscopy in 3 years.

## 2016-05-11 NOTE — Telephone Encounter (Signed)
Reminder in epic °

## 2016-05-11 NOTE — Telephone Encounter (Signed)
Tried to call with no answer  

## 2016-05-12 ENCOUNTER — Encounter (HOSPITAL_COMMUNITY): Payer: Self-pay | Admitting: Gastroenterology

## 2016-05-13 DIAGNOSIS — I10 Essential (primary) hypertension: Secondary | ICD-10-CM | POA: Diagnosis not present

## 2016-05-13 DIAGNOSIS — E785 Hyperlipidemia, unspecified: Secondary | ICD-10-CM | POA: Diagnosis not present

## 2016-05-13 NOTE — Telephone Encounter (Signed)
LMOM to call and also mailed a letter to call.  

## 2016-05-14 NOTE — Telephone Encounter (Signed)
Pt called and is aware of his results.  

## 2016-08-26 ENCOUNTER — Other Ambulatory Visit (HOSPITAL_COMMUNITY): Payer: Self-pay | Admitting: Family Medicine

## 2016-08-26 DIAGNOSIS — I10 Essential (primary) hypertension: Secondary | ICD-10-CM | POA: Diagnosis not present

## 2016-08-26 DIAGNOSIS — M545 Low back pain: Secondary | ICD-10-CM | POA: Diagnosis not present

## 2016-08-26 DIAGNOSIS — M79605 Pain in left leg: Secondary | ICD-10-CM

## 2016-08-26 DIAGNOSIS — M79604 Pain in right leg: Secondary | ICD-10-CM

## 2016-08-26 DIAGNOSIS — Z23 Encounter for immunization: Secondary | ICD-10-CM | POA: Diagnosis not present

## 2016-08-26 DIAGNOSIS — Z125 Encounter for screening for malignant neoplasm of prostate: Secondary | ICD-10-CM | POA: Diagnosis not present

## 2016-08-26 DIAGNOSIS — E785 Hyperlipidemia, unspecified: Secondary | ICD-10-CM | POA: Diagnosis not present

## 2016-09-04 ENCOUNTER — Encounter (HOSPITAL_COMMUNITY): Payer: Self-pay | Admitting: Radiology

## 2016-09-04 ENCOUNTER — Ambulatory Visit (HOSPITAL_COMMUNITY)
Admission: RE | Admit: 2016-09-04 | Discharge: 2016-09-04 | Disposition: A | Discharge status: Home / Self Care | Payer: Medicare Other | Source: Ambulatory Visit | Attending: Family Medicine | Admitting: Family Medicine

## 2016-09-04 DIAGNOSIS — M545 Low back pain: Secondary | ICD-10-CM | POA: Insufficient documentation

## 2016-09-04 DIAGNOSIS — E882 Lipomatosis, not elsewhere classified: Secondary | ICD-10-CM | POA: Diagnosis not present

## 2016-09-04 DIAGNOSIS — M4807 Spinal stenosis, lumbosacral region: Secondary | ICD-10-CM | POA: Diagnosis not present

## 2016-09-04 DIAGNOSIS — M5136 Other intervertebral disc degeneration, lumbar region: Secondary | ICD-10-CM | POA: Insufficient documentation

## 2016-09-04 DIAGNOSIS — M79604 Pain in right leg: Secondary | ICD-10-CM

## 2016-09-04 DIAGNOSIS — M48061 Spinal stenosis, lumbar region without neurogenic claudication: Secondary | ICD-10-CM | POA: Insufficient documentation

## 2016-09-04 LAB — CREATININE, SERUM
Creatinine, Ser: 0.98 mg/dL (ref 0.61–1.24)
GFR calc Af Amer: >60 mL/min (ref >60)
GFR calc non Af Amer: >60 mL/min (ref >60)

## 2016-09-04 MED ORDER — GADOBENATE DIMEGLUMINE 529 MG/ML IV SOLN
20.0000 mL | Freq: Once | INTRAVENOUS | Status: AC | PRN
  Administered 2016-09-04: 20 mL via INTRAVENOUS

## 2016-10-20 DIAGNOSIS — M545 Low back pain: Secondary | ICD-10-CM | POA: Diagnosis not present

## 2016-11-02 DIAGNOSIS — M545 Low back pain: Secondary | ICD-10-CM | POA: Diagnosis not present

## 2016-12-21 DIAGNOSIS — E119 Type 2 diabetes mellitus without complications: Secondary | ICD-10-CM | POA: Diagnosis not present

## 2016-12-21 DIAGNOSIS — L309 Dermatitis, unspecified: Secondary | ICD-10-CM | POA: Diagnosis not present

## 2016-12-21 DIAGNOSIS — I10 Essential (primary) hypertension: Secondary | ICD-10-CM | POA: Diagnosis not present

## 2017-03-09 DIAGNOSIS — R21 Rash and other nonspecific skin eruption: Secondary | ICD-10-CM | POA: Diagnosis not present

## 2017-03-09 DIAGNOSIS — I10 Essential (primary) hypertension: Secondary | ICD-10-CM | POA: Diagnosis not present

## 2017-05-05 DIAGNOSIS — B09 Unspecified viral infection characterized by skin and mucous membrane lesions: Secondary | ICD-10-CM | POA: Diagnosis not present

## 2017-05-05 DIAGNOSIS — I10 Essential (primary) hypertension: Secondary | ICD-10-CM | POA: Diagnosis not present

## 2017-05-05 DIAGNOSIS — E785 Hyperlipidemia, unspecified: Secondary | ICD-10-CM | POA: Diagnosis not present

## 2017-05-05 DIAGNOSIS — R21 Rash and other nonspecific skin eruption: Secondary | ICD-10-CM | POA: Diagnosis not present

## 2017-05-24 DIAGNOSIS — I10 Essential (primary) hypertension: Secondary | ICD-10-CM | POA: Diagnosis not present

## 2017-05-24 DIAGNOSIS — R42 Dizziness and giddiness: Secondary | ICD-10-CM | POA: Diagnosis not present

## 2017-05-25 DIAGNOSIS — R42 Dizziness and giddiness: Secondary | ICD-10-CM | POA: Diagnosis not present

## 2017-05-25 DIAGNOSIS — I10 Essential (primary) hypertension: Secondary | ICD-10-CM | POA: Diagnosis not present

## 2017-06-02 DIAGNOSIS — Z0000 Encounter for general adult medical examination without abnormal findings: Secondary | ICD-10-CM | POA: Diagnosis not present

## 2017-06-02 DIAGNOSIS — R42 Dizziness and giddiness: Secondary | ICD-10-CM | POA: Diagnosis not present

## 2017-06-28 DIAGNOSIS — H04123 Dry eye syndrome of bilateral lacrimal glands: Secondary | ICD-10-CM | POA: Diagnosis not present

## 2017-06-28 DIAGNOSIS — H01024 Squamous blepharitis left upper eyelid: Secondary | ICD-10-CM | POA: Diagnosis not present

## 2017-06-28 DIAGNOSIS — H01022 Squamous blepharitis right lower eyelid: Secondary | ICD-10-CM | POA: Diagnosis not present

## 2017-06-28 DIAGNOSIS — H01021 Squamous blepharitis right upper eyelid: Secondary | ICD-10-CM | POA: Diagnosis not present

## 2017-06-28 DIAGNOSIS — H01025 Squamous blepharitis left lower eyelid: Secondary | ICD-10-CM | POA: Diagnosis not present

## 2017-08-03 ENCOUNTER — Other Ambulatory Visit (HOSPITAL_COMMUNITY): Payer: Self-pay | Admitting: Family Medicine

## 2017-08-03 DIAGNOSIS — E119 Type 2 diabetes mellitus without complications: Secondary | ICD-10-CM | POA: Diagnosis not present

## 2017-08-03 DIAGNOSIS — Z23 Encounter for immunization: Secondary | ICD-10-CM | POA: Diagnosis not present

## 2017-08-03 DIAGNOSIS — Z720 Tobacco use: Secondary | ICD-10-CM

## 2017-08-03 DIAGNOSIS — I10 Essential (primary) hypertension: Secondary | ICD-10-CM | POA: Diagnosis not present

## 2017-08-03 DIAGNOSIS — Z125 Encounter for screening for malignant neoplasm of prostate: Secondary | ICD-10-CM | POA: Diagnosis not present

## 2017-08-06 DIAGNOSIS — E119 Type 2 diabetes mellitus without complications: Secondary | ICD-10-CM | POA: Diagnosis not present

## 2017-08-16 ENCOUNTER — Telehealth: Payer: Self-pay | Admitting: Family Medicine

## 2017-08-16 ENCOUNTER — Ambulatory Visit (HOSPITAL_COMMUNITY): Admission: RE | Admit: 2017-08-16 | Payer: Medicare Other | Source: Ambulatory Visit

## 2017-08-20 ENCOUNTER — Other Ambulatory Visit (HOSPITAL_COMMUNITY): Payer: Self-pay | Admitting: Family Medicine

## 2017-08-20 DIAGNOSIS — F17200 Nicotine dependence, unspecified, uncomplicated: Secondary | ICD-10-CM

## 2017-08-27 ENCOUNTER — Ambulatory Visit (HOSPITAL_COMMUNITY)
Admission: RE | Admit: 2017-08-27 | Discharge: 2017-08-27 | Disposition: A | Discharge status: Home / Self Care | Payer: Medicare Other | Source: Ambulatory Visit | Attending: Family Medicine | Admitting: Family Medicine

## 2017-08-27 DIAGNOSIS — F17200 Nicotine dependence, unspecified, uncomplicated: Secondary | ICD-10-CM

## 2017-08-27 DIAGNOSIS — Z122 Encounter for screening for malignant neoplasm of respiratory organs: Secondary | ICD-10-CM | POA: Insufficient documentation

## 2017-08-27 DIAGNOSIS — R911 Solitary pulmonary nodule: Secondary | ICD-10-CM | POA: Diagnosis not present

## 2017-08-27 DIAGNOSIS — Z87891 Personal history of nicotine dependence: Secondary | ICD-10-CM | POA: Diagnosis not present

## 2017-08-27 DIAGNOSIS — J432 Centrilobular emphysema: Secondary | ICD-10-CM | POA: Insufficient documentation

## 2017-10-07 DIAGNOSIS — L309 Dermatitis, unspecified: Secondary | ICD-10-CM | POA: Diagnosis not present

## 2017-10-07 DIAGNOSIS — I872 Venous insufficiency (chronic) (peripheral): Secondary | ICD-10-CM | POA: Diagnosis not present

## 2017-10-07 DIAGNOSIS — D2330 Other benign neoplasm of skin of unspecified part of face: Secondary | ICD-10-CM | POA: Diagnosis not present

## 2017-10-13 DIAGNOSIS — I872 Venous insufficiency (chronic) (peripheral): Secondary | ICD-10-CM | POA: Diagnosis not present

## 2017-10-13 DIAGNOSIS — L280 Lichen simplex chronicus: Secondary | ICD-10-CM | POA: Diagnosis not present

## 2017-11-18 ENCOUNTER — Other Ambulatory Visit: Payer: Self-pay | Admitting: *Deleted

## 2017-11-18 MED ORDER — ESOMEPRAZOLE MAGNESIUM 40 MG PO CPDR: DELAYED_RELEASE_CAPSULE | ORAL | 0 refills | Status: DC

## 2017-11-18 NOTE — Telephone Encounter (Signed)
Please tell the patient I can send in a refill for a few months, but he hasn't been seen in about 2 years and will need an OV followup for further refills.

## 2017-11-18 NOTE — Telephone Encounter (Signed)
Pt aware and appt scheduled.  

## 2017-11-18 NOTE — Telephone Encounter (Signed)
Patient called stating he needs refill on his nexium sent to [facility].

## 2017-12-06 DIAGNOSIS — I10 Essential (primary) hypertension: Secondary | ICD-10-CM | POA: Diagnosis not present

## 2017-12-06 DIAGNOSIS — R21 Rash and other nonspecific skin eruption: Secondary | ICD-10-CM | POA: Diagnosis not present

## 2017-12-06 DIAGNOSIS — K219 Gastro-esophageal reflux disease without esophagitis: Secondary | ICD-10-CM | POA: Diagnosis not present

## 2017-12-06 DIAGNOSIS — E119 Type 2 diabetes mellitus without complications: Secondary | ICD-10-CM | POA: Diagnosis not present

## 2017-12-06 DIAGNOSIS — E785 Hyperlipidemia, unspecified: Secondary | ICD-10-CM | POA: Diagnosis not present

## 2018-01-10 ENCOUNTER — Encounter: Payer: Self-pay | Admitting: Gastroenterology

## 2018-01-10 ENCOUNTER — Ambulatory Visit: Payer: Medicare Other | Admitting: Gastroenterology

## 2018-01-10 DIAGNOSIS — K219 Gastro-esophageal reflux disease without esophagitis: Secondary | ICD-10-CM | POA: Diagnosis not present

## 2018-01-10 MED ORDER — ESOMEPRAZOLE MAGNESIUM 40 MG PO CPDR: DELAYED_RELEASE_CAPSULE | ORAL | 3 refills | Status: DC

## 2018-01-10 NOTE — Assessment & Plan Note (Signed)
Typical symptoms well controlled on Nexium.  He is having some epigastric fullness/indigestion after meals off and on.  Often happens with typical foods which make him symptomatic with reflux such as meat loaf, spaghetti but also happens with greens.  He is really trying to lose some weight, plans to see the dietitian in the near future.  He feels like his bloating is related to his obesity.  We discussed possibility of increasing Nexium versus changing PPI versus upper endoscopy.  At this time patient states his symptoms are not frequent and he wants to monitor and see how things go after he loses some weight.  He will call if his symptoms become more frequent, worsen.  Otherwise we will see him back in to remain on Nexium

## 2018-01-10 NOTE — Progress Notes (Signed)
CC'D TO PCP °

## 2018-01-10 NOTE — Patient Instructions (Addendum)
1. You are due next colonoscopy in 04/2019. We will remind you next year.  2. Let me know if you have persistent or worsening bloating/indigestion after meals.  3. Return office visit in two years.

## 2018-01-10 NOTE — Progress Notes (Signed)
Primary Care Physician: Iona Beard, MD  Primary Gastroenterologist:  Barney Drain, MD   Chief Complaint  Patient presents with  . Gastroesophageal Reflux    F/U. Doing okay. needs refills on nexium    HPI: [name] is a 62 y.o. male here for follow-up of GERD.  Patient last seen in 2017 at time of colonoscopy for history of polyps.  He had multiple tubular adenomas again, next colonoscopy planned for July 2020.  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  He ran out of his Nexium for about a month.  His been back on for 2 months.  Overall his reflux is pretty well controlled.  He vomits if he eats fried chicken livers.  At times he has postprandial bloating/indigestion which begins in the epigastrium and radiates upward.  Happens with typical reflux related foods but also the other stuff as well.  Denies dysphagia.  Bowel movements are regular, 3 times daily.  No melena or rectal bleeding.  Weight is up about 10 pounds.  He states his doctor wants him less than 200 pounds.  He has an appointment with dietician soon.  He feels like his symptoms will get better once he is lost some weight.    History of excessively treated hepatitis C through [facility] in 2013.  Review of care everywhere shows that patient had pretreatment liver biopsy showing no evidence of advanced disease.  Last seen there in 2015.  He has been vaccinated for hepatitis B, immune to hepatitis A through infection.Ultrasound performed in 2015 showing normal liver.  Gallbladder normal.    Current Outpatient Medications  Medication Sig Dispense Refill  . amLODipine (NORVASC) 5 MG tablet Take 5 mg by mouth daily.     Marland Kitchen aspirin EC 81 MG tablet Take 81 mg by mouth daily.    Marland Kitchen esomeprazole (NEXIUM) 40 MG capsule 1 po 30 mins prior to your first meal TO PREVENT ULCERS AND REFLUX STRICTURES 90 capsule 0  . fenofibrate (TRICOR) 48 MG tablet Take 48 mg by mouth daily.     Marland Kitchen lisinopril-hydrochlorothiazide  (PRINZIDE,ZESTORETIC) 20-12.5 MG tablet Take by mouth.     No current facility-administered medications for this visit.     Allergies as of 01/10/2018  . (No Known Allergies)    ROS:  General: Negative for anorexia, weight loss, fever, chills, fatigue, weakness. ENT: Negative for hoarseness, difficulty swallowing , nasal congestion. CV: Negative for chest pain, angina, palpitations, dyspnea on exertion, peripheral edema.  Respiratory: Negative for dyspnea at rest, dyspnea on exertion, cough, sputum, wheezing.  GI: See history of present illness. GU:  Negative for dysuria, hematuria, urinary incontinence, urinary frequency, nocturnal urination.  Endo: Negative for unusual weight change.    Physical Examination:   BP (!) 150/68   Pulse 84   Temp (!) 97 F (36.1 C) (Oral)   Ht 5\' 10"  (1.778 m)   Wt 227 lb 9.6 oz (103.2 kg)   BMI 32.66 kg/m   General: Well-nourished, well-developed in no acute distress.  Eyes: No icterus. Mouth: Oropharyngeal mucosa moist and pink , no lesions erythema or exudate. Lungs: Clear to auscultation bilaterally.  Heart: Regular rate and rhythm, no murmurs rubs or gallops.  Abdomen: Bowel sounds are normal, nontender, nondistended, no hepatosplenomegaly or masses, no abdominal bruits or hernia , no rebound or guarding.   Extremities: No lower extremity edema. No clubbing or deformities. Neuro: Alert and oriented x 4   Skin: Warm and dry, no jaundice.  Psych: Alert and cooperative, normal mood and affect.

## 2018-01-25 ENCOUNTER — Encounter: Payer: Medicare Other | Attending: Family Medicine | Admitting: Nutrition

## 2018-01-25 ENCOUNTER — Encounter: Payer: Self-pay | Admitting: Nutrition

## 2018-01-25 VITALS — Ht 70.0 in | Wt 230.0 lb

## 2018-01-25 DIAGNOSIS — E669 Obesity, unspecified: Secondary | ICD-10-CM

## 2018-01-25 DIAGNOSIS — K2100 Gastro-esophageal reflux disease with esophagitis, without bleeding: Secondary | ICD-10-CM

## 2018-01-25 DIAGNOSIS — I10 Essential (primary) hypertension: Secondary | ICD-10-CM

## 2018-01-25 NOTE — Progress Notes (Signed)
  Medical Nutrition Therapy:  Appt start time: 0800 end time:  0900.   Assessment:  Primary concerns today:  Obesity and GERD and HTN.Marland Kitchen He lives with his wife and he does the shopping and cooking. Eats 3 meals per day. He cooks baked and fried or grilled. He has gained about 17 lbs in the last year. He admits to eating a lot of fast foods, chinese foods and processed meats. Not physical activity, but is willing to start walking. Has Silver [employer] and will start going to [facility]. Currently on disability for 10 yrs.  Usual weight is 218 lbs. He has back problems and right knee.  Enaged to make changes with diet and exerise for needed weight loss. He is in the process of stopping smoking. He is cutting down to ony 1/2 cigarette each time. Wants to quit.Referred him to [facility] Smoking Cessation [facility].  Preferred Learning Style:   No preference indicated   Learning Readiness:   Ready  Change in progress   MEDICATIONS:   DIETARY INTAKE:  24-hr recall:  B ( AM): [place] ham, grits, eggs over easy and waffles, water, coffee or smoothies; walnuts, steel cut oatmeal, yogurt, banana, OJ, ice,and honey. -32 oz  Snk ( AM):   L ( PM): 3 pm; Small pizza 1/2, soda Snk ( PM):  D ( PM):  Fried chicken wings, spinach/kale 1 cups, , baked beans 1/2, orange soda  Snk ( PM):  Beverages: soda, water,   Usual physical activity: ADL-1 mile, yard work  Estimated energy needs: 2000  calories 225 g carbohydrates 150  g protein 56 g fat  Progress Towards Goal(s):  In progress.   Nutritional Diagnosis:  NB-1.1 Food and nutrition-related knowledge deficit As related to OBesity.  As evidenced by BMI > 30.    Intervention:  Heart Healthy Nutrition, portion sizes, meal planning, low salt low fat low sugar high fiber diet. Risk for DM. Benefits of exercise 30 minutes a day. Weight loss tips. Diet for  REFLUX .  Goals 1. Follow My Plate 2. Increase fresh fruits and vegetables. 3. Exercise 15-30  minutes 3-4 times per day. 4. Reduce smoking. 5 Cut fried foods, soda and salt 6. Lose 1-2 lbs per week Reduce BP. Keep drinking water   Teaching Method Utilized:  Visual Auditory Hands on  Handouts given during visit include:  The Plate Method  Weight Loss Tips  Meal Plan Card  Barriers to learning/adherence to lifestyle change: none  Demonstrated degree of understanding via:  Teach Back   Monitoring/Evaluation:  Dietary intake, exercise, meal planning, and body weight in 1 month(s).

## 2018-01-25 NOTE — Patient Instructions (Signed)
Goals 1. Follow My Plate 2. Increase fresh fruits and vegetables. 3. Exercise 15-30 minutes 3-4 times per day. 4. Reduce smoking. 5 Cut fried foods, soda and salt 6. Lose 1-2 lbs per week Reduce BP. Keep drinking water

## 2018-03-01 ENCOUNTER — Encounter: Payer: Self-pay | Admitting: Nutrition

## 2018-03-01 ENCOUNTER — Encounter: Payer: Medicare Other | Attending: Family Medicine | Admitting: Nutrition

## 2018-03-01 NOTE — Progress Notes (Signed)
  Medical Nutrition Therapy:  Appt start time:  1000 end time:  1030  Assessment:  Primary concerns today:  Obesity and GERD and HTN..   Has lost 7 lbs. [facility] per week. Using measuring cups for portion control. Changed: Eating a lot more fresh fruit or vegetables.  Feels much. He is walking a lot more. No more swelling in his legs or feet.. Has cut down on smoking, down to 5 cig a day.  Follow up with Dr. Berdine Addison in JUne 2019. FBS 123 mg.dl.    Preferred Learning Style:   No preference indicated   Learning Readiness:   Ready  Change in progress   MEDICATIONS:   DIETARY INTAKE:  24-hr recall:  B ( AM): Cherrios multigrain and eggs and salsa, water Snk ( AM):   L ( PM): Smart pocket  Whole wheat and fruits, and spinach  Water Snk ( PM):  D ( PM): Grilled chicken, spinach, frozen veggies,  Water, Snk ( PM):  Beverages: water   Usual physical activity: ADL-1 mile, yard work  Estimated energy needs: 2000  calories 225 g carbohydrates 150  g protein 56 g fat  Progress Towards Goal(s):  In progress.   Nutritional Diagnosis:  NB-1.1 Food and nutrition-related knowledge deficit As related to OBesity.  As evidenced by BMI > 30.    Intervention:  Heart Healthy Nutrition, portion sizes, meal planning, low salt low fat low sugar high fiber diet. Risk for DM. Benefits of exercise 30 minutes a day. Weight loss tips. Diet for  REFLUX .  Goals 1. Keep up the great job! 2. Continue what you're doing 3.Lose 1-2 lbs per week.  Teaching Method Utilized:  Visual Auditory Hands on  Handouts given during visit include:  The Plate Method  Weight Loss Tips  Meal Plan Card  Barriers to learning/adherence to lifestyle change: none  Demonstrated degree of understanding via:  Teach Back   Monitoring/Evaluation:  Dietary intake, exercise, meal planning, and body weight in 3-6  Month(s).    Check an A1C to better evaluate for prediabetes or Type 2 DM.

## 2018-03-01 NOTE — Patient Instructions (Addendum)
Goals 1. Keep up the great job! 2. Continue what you're doing 3.Lose 1-2 lbs per week.

## 2018-03-15 ENCOUNTER — Other Ambulatory Visit: Payer: Self-pay | Admitting: Dermatology

## 2018-03-15 DIAGNOSIS — D485 Neoplasm of uncertain behavior of skin: Secondary | ICD-10-CM | POA: Diagnosis not present

## 2018-03-15 DIAGNOSIS — L281 Prurigo nodularis: Secondary | ICD-10-CM | POA: Diagnosis not present

## 2018-03-15 DIAGNOSIS — I872 Venous insufficiency (chronic) (peripheral): Secondary | ICD-10-CM | POA: Diagnosis not present

## 2018-04-06 DIAGNOSIS — K219 Gastro-esophageal reflux disease without esophagitis: Secondary | ICD-10-CM | POA: Diagnosis not present

## 2018-04-06 DIAGNOSIS — J439 Emphysema, unspecified: Secondary | ICD-10-CM | POA: Diagnosis not present

## 2018-04-06 DIAGNOSIS — Z720 Tobacco use: Secondary | ICD-10-CM | POA: Diagnosis not present

## 2018-04-06 DIAGNOSIS — R21 Rash and other nonspecific skin eruption: Secondary | ICD-10-CM | POA: Diagnosis not present

## 2018-04-06 DIAGNOSIS — I10 Essential (primary) hypertension: Secondary | ICD-10-CM | POA: Diagnosis not present

## 2018-06-27 ENCOUNTER — Ambulatory Visit: Payer: Medicare Other | Admitting: Nutrition

## 2018-08-08 DIAGNOSIS — Z23 Encounter for immunization: Secondary | ICD-10-CM | POA: Diagnosis not present

## 2018-08-08 DIAGNOSIS — R739 Hyperglycemia, unspecified: Secondary | ICD-10-CM | POA: Diagnosis not present

## 2018-08-08 DIAGNOSIS — Z0000 Encounter for general adult medical examination without abnormal findings: Secondary | ICD-10-CM | POA: Diagnosis not present

## 2018-09-15 ENCOUNTER — Other Ambulatory Visit: Payer: Self-pay

## 2018-09-15 NOTE — Patient Outreach (Signed)
[facility]) Care Management  09/15/2018  [name] [birth year] [id number]   Medication Adherence call to [name] left a message for patient to call back patient is due on Lisinopril/HCTZ 20/12.5 mg. [name] is showing past due under [place].   [place] Management Direct Dial [phone]  Fax [phone] [email or web address]

## 2018-11-07 DIAGNOSIS — E785 Hyperlipidemia, unspecified: Secondary | ICD-10-CM | POA: Diagnosis not present

## 2018-11-07 DIAGNOSIS — I10 Essential (primary) hypertension: Secondary | ICD-10-CM | POA: Diagnosis not present

## 2018-11-16 ENCOUNTER — Other Ambulatory Visit: Payer: Self-pay | Admitting: Family Medicine

## 2018-11-16 DIAGNOSIS — J439 Emphysema, unspecified: Secondary | ICD-10-CM

## 2018-11-22 ENCOUNTER — Emergency Department (HOSPITAL_COMMUNITY)
Admission: EM | Admit: 2018-11-22 | Discharge: 2018-11-22 | Disposition: A | Discharge status: Home / Self Care | Payer: Medicare Other | Attending: Emergency Medicine | Admitting: Emergency Medicine

## 2018-11-22 ENCOUNTER — Other Ambulatory Visit: Payer: Self-pay

## 2018-11-22 ENCOUNTER — Encounter (HOSPITAL_COMMUNITY): Payer: Self-pay | Admitting: Emergency Medicine

## 2018-11-22 ENCOUNTER — Emergency Department (HOSPITAL_COMMUNITY): Payer: Medicare Other

## 2018-11-22 DIAGNOSIS — J45909 Unspecified asthma, uncomplicated: Secondary | ICD-10-CM | POA: Diagnosis not present

## 2018-11-22 DIAGNOSIS — F17210 Nicotine dependence, cigarettes, uncomplicated: Secondary | ICD-10-CM | POA: Diagnosis not present

## 2018-11-22 DIAGNOSIS — R509 Fever, unspecified: Secondary | ICD-10-CM | POA: Diagnosis present

## 2018-11-22 DIAGNOSIS — R000 Tachycardia, unspecified: Secondary | ICD-10-CM | POA: Diagnosis not present

## 2018-11-22 DIAGNOSIS — R079 Chest pain, unspecified: Secondary | ICD-10-CM | POA: Diagnosis not present

## 2018-11-22 DIAGNOSIS — J101 Influenza due to other identified influenza virus with other respiratory manifestations: Secondary | ICD-10-CM | POA: Diagnosis not present

## 2018-11-22 DIAGNOSIS — R0602 Shortness of breath: Secondary | ICD-10-CM | POA: Diagnosis not present

## 2018-11-22 DIAGNOSIS — R05 Cough: Secondary | ICD-10-CM | POA: Diagnosis not present

## 2018-11-22 HISTORY — DX: Unspecified asthma, uncomplicated: J45.909

## 2018-11-22 LAB — BASIC METABOLIC PANEL
Anion gap: 11 (ref 5–15)
BUN: 9 mg/dL (ref 8–23)
CO2: 23 mmol/L (ref 22–32)
Calcium: 8.9 mg/dL (ref 8.9–10.3)
Chloride: 103 mmol/L (ref 98–111)
Creatinine, Ser: 0.9 mg/dL (ref 0.61–1.24)
GFR calc Af Amer: >60 mL/min (ref >60)
GFR calc non Af Amer: >60 mL/min (ref >60)
Glucose, Bld: 133 mg/dL — ABNORMAL HIGH (ref 70–99)
Potassium: 3.3 mmol/L — ABNORMAL LOW (ref 3.5–5.1)
Sodium: 137 mmol/L (ref 135–145)

## 2018-11-22 LAB — CBC
HCT: 45.6 % (ref 39.0–52.0)
Hemoglobin: 15.1 g/dL (ref 13.0–17.0)
MCH: 28.9 pg (ref 26.0–34.0)
MCHC: 33.1 g/dL (ref 30.0–36.0)
MCV: 87.2 fL (ref 80.0–100.0)
Platelets: 277 10*3/uL (ref 150–400)
RBC: 5.23 MIL/uL (ref 4.22–5.81)
RDW: 12.8 % (ref 11.5–15.5)
WBC: 8.9 10*3/uL (ref 4.0–10.5)
nRBC: 0 % (ref 0.0–0.2)

## 2018-11-22 LAB — INFLUENZA PANEL BY PCR (TYPE A & B)
Influenza A By PCR: POSITIVE — AB
Influenza B By PCR: NEGATIVE

## 2018-11-22 LAB — TROPONIN I: Troponin I: <0.03 ng/mL (ref <0.03)

## 2018-11-22 MED ORDER — SODIUM CHLORIDE 0.9% FLUSH: 3.0000 mL | Freq: Once | INTRAVENOUS | Status: DC

## 2018-11-22 MED ORDER — IPRATROPIUM-ALBUTEROL 0.5-2.5 (3) MG/3ML IN SOLN
3.0000 mL | Freq: Once | RESPIRATORY_TRACT | Status: AC
  Administered 2018-11-22: 3 mL via RESPIRATORY_TRACT
  Filled 2018-11-22: qty 3

## 2018-11-22 MED ORDER — PREDNISONE 50 MG PO TABS
60.0000 mg | ORAL_TABLET | Freq: Once | ORAL | Status: AC
  Administered 2018-11-22: 60 mg via ORAL
  Filled 2018-11-22: qty 1

## 2018-11-22 MED ORDER — OSELTAMIVIR PHOSPHATE 75 MG PO CAPS
75.0000 mg | ORAL_CAPSULE | Freq: Once | ORAL | Status: AC
  Administered 2018-11-22: 75 mg via ORAL
  Filled 2018-11-22: qty 1

## 2018-11-22 MED ORDER — OSELTAMIVIR PHOSPHATE 75 MG PO CAPS: 75.0000 mg | ORAL_CAPSULE | Freq: Two times a day (BID) | ORAL | 0 refills | Status: DC

## 2018-11-22 NOTE — Discharge Instructions (Addendum)
You were evaluated in the Emergency Department and after careful evaluation, we did not find any emergent condition requiring admission or further testing in the hospital.  Your symptoms today seem to be due to the flu.  Please take the medication as directed and drink plenty of fluids at home.  Please return to the Emergency Department if you experience any worsening of your condition.  We encourage you to follow up with a primary care provider.  Thank you for allowing [place] to be a part of your care.

## 2018-11-22 NOTE — ED Provider Notes (Signed)
[facility] Emergency Department Provider Note MRN:  [id number]  Arrival date & time: 11/22/18     Chief Complaint   Fever   History of Present Illness   [name] is a 63 y.o. year-old male with a history of cocaine use, tobacco abuse presenting to the ED with chief complaint of fever.  1 day of fever, cough, scratchy throat, body aches, chest soreness when coughing, shortness of breath.  Symptoms are constant, no exacerbating relieving factors.  Unsure if he has been exposed to the flu.  Denies abdominal pain, no dysuria, no numbness weakness to the arms or legs.  Denies recent cocaine use.  Review of Systems  A complete 10 system review of systems was obtained and all systems are negative except as noted in the HPI and PMH.   Patient's Health History    Past Medical History:  Diagnosis Date  . Alcohol abuse   . Arthritis   . Asthma   . 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 Cocaine dependence ([place])   . Dysphagia   . Hepatitis   . Personal history of colonic polyps     Past Surgical History:  Procedure Laterality Date  . BIOPSY  05/05/2016   Procedure: BIOPSY;  Surgeon: Danie Binder, MD;  [facility];  Service: Endoscopy;;  transverse colon polyp  . COLONOSCOPY     SLF: Multiple polyps, simple adenoma. NEXT TCS 04/2019  . COLONOSCOPY WITH PROPOFOL N/A 05/05/2016   Procedure: COLONOSCOPY WITH PROPOFOL;  Surgeon: Danie Binder, MD;  [facility];  Service: Endoscopy;  Laterality: N/A;  1000  . KNEE ARTHROPLASTY Left   . KNEE ARTHROSCOPY Right   . POLYPECTOMY  05/05/2016   Procedure: POLYPECTOMY;  Surgeon: Danie Binder, MD;  [facility];  Service: Endoscopy;;  transverse colon, descending colon and sigmoid colon polyps  . TRIGGER FINGER RELEASE Left 2010    Family History  Problem Relation Age of Onset  . Colon cancer Neg Hx     Social History   Socioeconomic History  . Marital status: Married    Spouse  name: Not on file  . Number of children: Not on file  . Years of education: Not on file  . Highest education level: Not on file  Occupational History  . Not on file  Social Needs  . Financial resource strain: Not on file  . Food insecurity:    Worry: Not on file    Inability: Not on file  . Transportation needs:    Medical: Not on file    Non-medical: Not on file  Tobacco Use  . Smoking status: Current Every Day Smoker    Packs/day: 1.00    Types: Cigarettes  . Smokeless tobacco: Never Used  Substance and Sexual Activity  . Alcohol use: Yes    Alcohol/week: 0.0 standard drinks    Comment: once a week, one pint per sitting.  . Drug use: Yes    Types: Marijuana    Comment: Occasional marijuana  . Sexual activity: Not on file  Lifestyle  . Physical activity:    Days per week: Not on file    Minutes per session: Not on file  . Stress: Not on file  Relationships  . Social connections:    Talks on phone: Not on file    Gets together: Not on file    Attends religious service: Not on file    Active member of club  or organization: Not on file    Attends meetings of clubs or organizations: Not on file    Relationship status: Not on file  . Intimate partner violence:    Fear of current or ex partner: Not on file    Emotionally abused: Not on file    Physically abused: Not on file    Forced sexual activity: Not on file  Other Topics Concern  . Not on file  Social History Narrative  . Not on file     Physical Exam  Vital Signs and Nursing Notes reviewed Vitals:   11/22/18 2021 11/22/18 2226  BP: (!) 182/84   Pulse: 99   Resp: 20   Temp: 98.2 F (36.8 C)   SpO2: 93% 95%    CONSTITUTIONAL: Well-appearing, NAD NEURO:  Alert and oriented x 3, no focal deficits EYES:  eyes equal and reactive ENT/NECK:  no LAD, no JVD CARDIO: Regular rate, well-perfused, normal S1 and S2 PULM:  CTAB no wheezing or rhonchi GI/GU:  normal bowel sounds, non-distended,  non-tender MSK/SPINE:  No gross deformities, no edema SKIN:  no rash, atraumatic PSYCH:  Appropriate speech and behavior  Diagnostic and Interventional Summary    EKG Interpretation  Date/Time:  Tuesday November 22 2018 20:25:49 EST Ventricular Rate:  102 PR Interval:  146 QRS Duration: 92 QT Interval:  322 QTC Calculation: 419 R Axis:   75 Text Interpretation:  Sinus tachycardia Otherwise normal ECG Confirmed by Gerlene Fee [phone]) on 11/22/2018 11:30:53 PM      Labs Reviewed  BASIC METABOLIC PANEL - Abnormal; Notable for the following components:      Result Value   Potassium 3.3 (*)    Glucose, Bld 133 (*)    All other components within normal limits  INFLUENZA PANEL BY PCR (TYPE A & B) - Abnormal; Notable for the following components:   Influenza A By PCR POSITIVE (*)    All other components within normal limits  CBC  TROPONIN I    DG Chest 2 View  Final Result      Medications  sodium chloride flush (NS) 0.9 % injection 3 mL (has no administration in time range)  oseltamivir (TAMIFLU) capsule 75 mg (has no administration in time range)  ipratropium-albuterol (DUONEB) 0.5-2.5 (3) MG/3ML nebulizer solution 3 mL (3 mLs Nebulization Given 11/22/18 2225)  predniSONE (DELTASONE) tablet 60 mg (60 mg Oral Given 11/22/18 2237)     Procedures Critical Care  ED Course and Medical Decision Making  I have reviewed the triage vital signs and the nursing notes.  Pertinent labs & imaging results that were available during my care of the patient were reviewed by me and considered in my medical decision making (see below for details).  Atypical chest pain related to cough, favoring viral illness, likely influenza, also considering pneumonia, no wheezing or increased work of breathing to suggest COPD exacerbation.  Patient is well-appearing, positive for influenza A, troponin negative, chest x-ray without evidence of pneumonia.  Ambulated in the ED with pulse ox, did well, did not  desaturate.  Appropriate for discharge with Tamiflu.  After the discussed management above, the patient was determined to be safe for discharge.  The patient was in agreement with this plan and all questions regarding their care were answered.  ED return precautions were discussed and the patient will return to the ED with any significant worsening of condition.  Barth Kirks. Sedonia Small, [place] [email or web address]  Final Clinical  Impressions(s) / ED Diagnoses     ICD-10-CM   1. Influenza A J10.1     ED Discharge Orders         Ordered    oseltamivir (TAMIFLU) 75 MG capsule  Every 12 hours     11/22/18 2334             Maudie Flakes, MD 11/22/18 [phone]

## 2018-11-22 NOTE — ED Notes (Signed)
Ambulated pt 02 started out at 97%,full circle around nursing station 02 ended at 93%

## 2018-11-22 NOTE — ED Triage Notes (Signed)
Pt c/o fever, headache, and cough. Pt also c/o chest pain.

## 2018-12-01 ENCOUNTER — Ambulatory Visit (HOSPITAL_COMMUNITY)
Admission: RE | Admit: 2018-12-01 | Discharge: 2018-12-01 | Disposition: A | Discharge status: Home / Self Care | Payer: Medicare Other | Source: Ambulatory Visit | Attending: Family Medicine | Admitting: Family Medicine

## 2018-12-01 DIAGNOSIS — F17210 Nicotine dependence, cigarettes, uncomplicated: Secondary | ICD-10-CM | POA: Insufficient documentation

## 2018-12-01 DIAGNOSIS — J439 Emphysema, unspecified: Secondary | ICD-10-CM | POA: Diagnosis not present

## 2019-01-25 DIAGNOSIS — J439 Emphysema, unspecified: Secondary | ICD-10-CM | POA: Diagnosis not present

## 2019-01-25 DIAGNOSIS — J309 Allergic rhinitis, unspecified: Secondary | ICD-10-CM | POA: Diagnosis not present

## 2019-01-25 DIAGNOSIS — J0110 Acute frontal sinusitis, unspecified: Secondary | ICD-10-CM | POA: Diagnosis not present

## 2019-02-06 DIAGNOSIS — J439 Emphysema, unspecified: Secondary | ICD-10-CM | POA: Diagnosis not present

## 2019-02-06 DIAGNOSIS — Z7189 Other specified counseling: Secondary | ICD-10-CM | POA: Diagnosis not present

## 2019-02-06 DIAGNOSIS — Z720 Tobacco use: Secondary | ICD-10-CM | POA: Diagnosis not present

## 2019-02-06 DIAGNOSIS — R42 Dizziness and giddiness: Secondary | ICD-10-CM | POA: Diagnosis not present

## 2019-03-02 ENCOUNTER — Other Ambulatory Visit: Payer: Self-pay | Admitting: Family Medicine

## 2019-03-02 ENCOUNTER — Other Ambulatory Visit (HOSPITAL_COMMUNITY): Payer: Self-pay | Admitting: Family Medicine

## 2019-03-02 DIAGNOSIS — R42 Dizziness and giddiness: Secondary | ICD-10-CM

## 2019-03-08 ENCOUNTER — Other Ambulatory Visit: Payer: Self-pay

## 2019-03-08 ENCOUNTER — Ambulatory Visit (HOSPITAL_COMMUNITY)
Admission: RE | Admit: 2019-03-08 | Discharge: 2019-03-08 | Disposition: A | Discharge status: Home / Self Care | Payer: Medicare Other | Source: Ambulatory Visit | Attending: Family Medicine | Admitting: Family Medicine

## 2019-03-08 DIAGNOSIS — R42 Dizziness and giddiness: Secondary | ICD-10-CM | POA: Diagnosis not present

## 2019-03-08 DIAGNOSIS — J0130 Acute sphenoidal sinusitis, unspecified: Secondary | ICD-10-CM | POA: Diagnosis not present

## 2019-03-14 DIAGNOSIS — Z0000 Encounter for general adult medical examination without abnormal findings: Secondary | ICD-10-CM | POA: Diagnosis not present

## 2019-03-14 DIAGNOSIS — R42 Dizziness and giddiness: Secondary | ICD-10-CM | POA: Diagnosis not present

## 2019-04-04 ENCOUNTER — Encounter: Payer: Self-pay | Admitting: Gastroenterology

## 2019-05-08 DIAGNOSIS — R42 Dizziness and giddiness: Secondary | ICD-10-CM | POA: Diagnosis not present

## 2019-05-08 DIAGNOSIS — Z720 Tobacco use: Secondary | ICD-10-CM | POA: Diagnosis not present

## 2019-05-08 DIAGNOSIS — I10 Essential (primary) hypertension: Secondary | ICD-10-CM | POA: Diagnosis not present

## 2019-07-31 ENCOUNTER — Other Ambulatory Visit: Payer: Self-pay

## 2019-07-31 NOTE — Patient Outreach (Signed)
[facility]) Care Management  07/31/2019  NEMIAH SCALLION 1955/12/08 [id number]   Medication Adherence call to Mr. Zacary Deloe HIPPA Compliant Voice message left with a call back number. Mr. Hathorne is showing past due on Lisinopril/Hctz 20/12.5 mg under [place].   [place] Management Direct Dial [phone]  Fax [phone] [email or web address]

## 2019-08-08 DIAGNOSIS — I10 Essential (primary) hypertension: Secondary | ICD-10-CM | POA: Diagnosis not present

## 2019-10-10 ENCOUNTER — Other Ambulatory Visit: Payer: Self-pay | Admitting: Dermatology

## 2019-10-10 DIAGNOSIS — B078 Other viral warts: Secondary | ICD-10-CM | POA: Diagnosis not present

## 2019-10-10 DIAGNOSIS — L281 Prurigo nodularis: Secondary | ICD-10-CM | POA: Diagnosis not present

## 2019-10-10 DIAGNOSIS — D485 Neoplasm of uncertain behavior of skin: Secondary | ICD-10-CM | POA: Diagnosis not present

## 2019-11-29 DIAGNOSIS — H04123 Dry eye syndrome of bilateral lacrimal glands: Secondary | ICD-10-CM | POA: Diagnosis not present

## 2019-11-29 DIAGNOSIS — H2513 Age-related nuclear cataract, bilateral: Secondary | ICD-10-CM | POA: Diagnosis not present

## 2019-11-29 DIAGNOSIS — Q100 Congenital ptosis: Secondary | ICD-10-CM | POA: Diagnosis not present

## 2019-11-29 DIAGNOSIS — H0102A Squamous blepharitis right eye, upper and lower eyelids: Secondary | ICD-10-CM | POA: Diagnosis not present

## 2019-11-29 DIAGNOSIS — H0102B Squamous blepharitis left eye, upper and lower eyelids: Secondary | ICD-10-CM | POA: Diagnosis not present

## 2019-12-10 DIAGNOSIS — I10 Essential (primary) hypertension: Secondary | ICD-10-CM | POA: Diagnosis not present

## 2019-12-10 DIAGNOSIS — M4807 Spinal stenosis, lumbosacral region: Secondary | ICD-10-CM | POA: Diagnosis not present

## 2019-12-10 DIAGNOSIS — E785 Hyperlipidemia, unspecified: Secondary | ICD-10-CM | POA: Diagnosis not present

## 2019-12-12 DIAGNOSIS — E782 Mixed hyperlipidemia: Secondary | ICD-10-CM | POA: Diagnosis not present

## 2019-12-12 DIAGNOSIS — E785 Hyperlipidemia, unspecified: Secondary | ICD-10-CM | POA: Diagnosis not present

## 2019-12-12 DIAGNOSIS — J439 Emphysema, unspecified: Secondary | ICD-10-CM | POA: Diagnosis not present

## 2019-12-12 DIAGNOSIS — I10 Essential (primary) hypertension: Secondary | ICD-10-CM | POA: Diagnosis not present

## 2019-12-12 DIAGNOSIS — Z720 Tobacco use: Secondary | ICD-10-CM | POA: Diagnosis not present

## 2019-12-18 ENCOUNTER — Other Ambulatory Visit (HOSPITAL_COMMUNITY): Payer: Self-pay | Admitting: Family Medicine

## 2019-12-18 ENCOUNTER — Other Ambulatory Visit: Payer: Self-pay | Admitting: Family Medicine

## 2019-12-18 DIAGNOSIS — Z720 Tobacco use: Secondary | ICD-10-CM

## 2019-12-18 DIAGNOSIS — J439 Emphysema, unspecified: Secondary | ICD-10-CM

## 2019-12-18 DIAGNOSIS — I700 Atherosclerosis of aorta: Secondary | ICD-10-CM

## 2020-01-01 ENCOUNTER — Other Ambulatory Visit: Payer: Self-pay

## 2020-01-01 ENCOUNTER — Ambulatory Visit (HOSPITAL_COMMUNITY)
Admission: RE | Admit: 2020-01-01 | Discharge: 2020-01-01 | Disposition: A | Discharge status: Home / Self Care | Payer: Medicare Other | Source: Ambulatory Visit | Attending: Family Medicine | Admitting: Family Medicine

## 2020-01-01 DIAGNOSIS — Z720 Tobacco use: Secondary | ICD-10-CM | POA: Diagnosis not present

## 2020-01-01 DIAGNOSIS — I700 Atherosclerosis of aorta: Secondary | ICD-10-CM | POA: Diagnosis not present

## 2020-01-01 DIAGNOSIS — F17210 Nicotine dependence, cigarettes, uncomplicated: Secondary | ICD-10-CM | POA: Diagnosis not present

## 2020-01-01 DIAGNOSIS — Z122 Encounter for screening for malignant neoplasm of respiratory organs: Secondary | ICD-10-CM | POA: Insufficient documentation

## 2020-01-01 DIAGNOSIS — J439 Emphysema, unspecified: Secondary | ICD-10-CM | POA: Diagnosis not present

## 2020-01-08 ENCOUNTER — Encounter: Payer: Self-pay | Admitting: Gastroenterology

## 2020-01-08 ENCOUNTER — Other Ambulatory Visit: Payer: Self-pay

## 2020-01-08 ENCOUNTER — Ambulatory Visit: Payer: Medicare Other | Admitting: Gastroenterology

## 2020-01-08 VITALS — BP 162/76 | HR 95 | Temp 97.6°F | Ht 70.0 in | Wt 234.6 lb

## 2020-01-08 DIAGNOSIS — Z86010 Personal history of colonic polyps: Secondary | ICD-10-CM | POA: Diagnosis not present

## 2020-01-08 DIAGNOSIS — K219 Gastro-esophageal reflux disease without esophagitis: Secondary | ICD-10-CM | POA: Diagnosis not present

## 2020-01-08 MED ORDER — ESOMEPRAZOLE MAGNESIUM 40 MG PO CPDR: DELAYED_RELEASE_CAPSULE | ORAL | 3 refills | Status: DC

## 2020-01-08 MED ORDER — PEG 3350-KCL-NA BICARB-NACL 420 G PO SOLR: 4000.0000 mL | ORAL | 0 refills | Status: DC

## 2020-01-08 NOTE — Assessment & Plan Note (Signed)
Had been doing well using Nexium prn up until recent consumption of old spaghetti sauce. Has had some reflux, heartburn, malodorous belching which is all improved at this time. He currently is not having any abdominal pain or dysphagia.   Would first recommend resuming Nexium daily before breakfast daily for 1-2 months. If symptoms settle down then continue Nexium daily prn. If persistent issues, may need to consider EGD. Patient voiced understanding.

## 2020-01-08 NOTE — Assessment & Plan Note (Signed)
Due for surveillance colonoscopy for history of numerous adenomatous colon polyps.  Currently 1 year overdue.  No bowel concerns.  Plan for colonoscopy with deep sedation (etoh use and prior cocaine use) in the near future.  I have discussed the risks, alternatives, benefits with regards to but not limited to the risk of reaction to medication, bleeding, infection, perforation and the patient is agreeable to proceed. Written consent to be obtained.

## 2020-01-08 NOTE — Patient Instructions (Signed)
1. Colonoscopy as scheduled. See separate instructions.  2. Restart Nexium 40mg  daily before breakfast. Take for several weeks until your reflux and belching symptoms settle down. Then you can consider taking as needed only if you are doing well.  3. Return to the office in one year for follow of GERD.

## 2020-01-08 NOTE — Progress Notes (Signed)
Primary Care Physician:  Iona Beard, MD  Primary Gastroenterologist:  Barney Drain, MD   Chief Complaint  Patient presents with  . Consult    TCS last done 2017  . Gastroesophageal Reflux    reports burps taste like "boiled eggs"    HPI:  [name] is a 64 y.o. male here to schedule surveillance colonoscopy.  His last colonoscopy was in 2017, he was advised to have a 3-year follow-up.  He had numerous tubular adenomas removed from the colon.  Patient desires scheduling colonoscopy. He is late due to Covid 19 pandemic. BM regular, three times daily.  No melena, brbpr. No abdominal pain. No n/v. No dysphagia. He has been out of Nexium for awhile. Previously was only taking as needed with certain foods. Recently ate old spaghetti and developed intense reflux, belching, burning and belches smelt like rotten eggs. No vomiting or diarrhea. This is resolving.   Current Outpatient Medications  Medication Sig Dispense Refill  . amLODipine (NORVASC) 5 MG tablet Take 5 mg by mouth daily.     Marland Kitchen aspirin EC 81 MG tablet Take 81 mg by mouth daily.    Marland Kitchen esomeprazole (NEXIUM) 40 MG capsule 1 po 30 mins prior to your first meal TO PREVENT ULCERS AND REFLUX STRICTURES 90 capsule 3  . fenofibrate (TRICOR) 48 MG tablet Take 48 mg by mouth daily.     Marland Kitchen lisinopril-hydrochlorothiazide (PRINZIDE,ZESTORETIC) 20-12.5 MG tablet Take by mouth.     No current facility-administered medications for this visit.    Allergies as of 01/08/2020  . (No Known Allergies)    Past Medical History:  Diagnosis Date  . Alcohol abuse   . Arthritis   . Asthma   . Carrier of viral hepatitis ([place])   . Chronic hepatitis C ([place])    successfully treated at [facility]  . Cocaine dependence ([place])   . Dysphagia   . Hepatitis   . Personal history of colonic polyps     Past Surgical History:  Procedure Laterality Date  . BIOPSY  05/05/2016   Procedure: BIOPSY;  Surgeon: Danie Binder, MD;  [facility];   Service: Endoscopy;;  transverse colon polyp  . COLONOSCOPY     SLF: Multiple polyps, simple adenoma. NEXT TCS 04/2019  . COLONOSCOPY WITH PROPOFOL N/A 05/05/2016   Procedure: COLONOSCOPY WITH PROPOFOL;  Surgeon: Danie Binder, MD;  [facility];  Service: Endoscopy;  Laterality: N/A;  1000  . ESOPHAGOGASTRODUODENOSCOPY  2011   Dr. Oneida Alar: Probable esophageal web status post dilation, gastritis.  Biopsies benign, no H. pylori  . KNEE ARTHROPLASTY Left   . KNEE ARTHROSCOPY Right   . POLYPECTOMY  05/05/2016   Procedure: POLYPECTOMY;  Surgeon: Danie Binder, MD;  [facility];  Service: Endoscopy;;  transverse colon, descending colon and sigmoid colon polyps  . TRIGGER FINGER RELEASE Left 2010    Family History  Problem Relation Age of Onset  . Colon cancer Neg Hx     Social History   Socioeconomic History  . Marital status: Married    Spouse name: Not on file  . Number of children: Not on file  . Years of education: Not on file  . Highest education level: Not on file  Occupational History  . Not on file  Tobacco Use  . Smoking status: Current Every Day Smoker    Packs/day: 1.00    Types: Cigarettes  . Smokeless tobacco: Never Used  Substance and Sexual Activity  .  Alcohol use: Yes    Alcohol/week: 0.0 standard drinks    Comment: once a week, one pint per sitting.  . Drug use: Not Currently    Types: Marijuana    Comment: Occasional marijuana  . Sexual activity: Not on file  Other Topics Concern  . Not on file  Social History Narrative  . Not on file   Social Determinants of Health   Financial Resource Strain:   . Difficulty of Paying Living Expenses:   Food Insecurity:   . Worried About Charity fundraiser in the Last Year:   . Arboriculturist in the Last Year:   Transportation Needs:   . Film/video editor (Medical):   Marland Kitchen Lack of Transportation (Non-Medical):   Physical Activity:   . Days of Exercise per Week:   . Minutes of Exercise  per Session:   Stress:   . Feeling of Stress :   Social Connections:   . Frequency of Communication with Friends and Family:   . Frequency of Social Gatherings with Friends and Family:   . Attends Religious Services:   . Active Member of Clubs or Organizations:   . Attends Archivist Meetings:   Marland Kitchen Marital Status:   Intimate Partner Violence:   . Fear of Current or Ex-Partner:   . Emotionally Abused:   Marland Kitchen Physically Abused:   . Sexually Abused:       ROS:  General: Negative for anorexia, unintentional weight loss, fever, chills, fatigue, weakness. Eyes: Negative for vision changes.  ENT: Negative for hoarseness, difficulty swallowing , nasal congestion. CV: Negative for chest pain, angina, palpitations, dyspnea on exertion, peripheral edema.  Respiratory: Negative for dyspnea at rest, dyspnea on exertion, cough, sputum, wheezing.  GI: See history of present illness. GU:  Negative for dysuria, hematuria, urinary incontinence, urinary frequency, nocturnal urination.  MS: Negative for joint pain, low back pain.  Derm: Negative for rash or itching.  Neuro: Negative for weakness, abnormal sensation, seizure, frequent headaches, memory loss, confusion.  Psych: Negative for anxiety, depression, suicidal ideation, hallucinations.  Endo: Negative for unusual weight change.  Heme: Negative for bruising or bleeding. Allergy: Negative for rash or hives.    Physical Examination:  BP (!) 162/76   Pulse 95   Temp 97.6 F (36.4 C) (Oral)   Ht 5\' 10"  (1.778 m)   Wt 234 lb 9.6 oz (106.4 kg)   BMI 33.66 kg/m    General: Well-nourished, well-developed in no acute distress.  Head: Normocephalic, atraumatic.   Eyes: Conjunctiva pink, no icterus. Mouth:masked Neck: Supple without thyromegaly, masses, or lymphadenopathy.  Lungs: Clear to auscultation bilaterally.  Heart: Regular rate and rhythm, no murmurs rubs or gallops.  Abdomen: Bowel sounds are normal, nontender,  nondistended, no hepatosplenomegaly or masses, no abdominal bruits or    hernia , no rebound or guarding.   Rectal: not performed Extremities: No lower extremity edema. No clubbing or deformities.  Neuro: Alert and oriented x 4 , grossly normal neurologically.  Skin: Warm and dry, no rash or jaundice.   Psych: Alert and cooperative, normal mood and affect.  Labs: Lab Results  Component Value Date   WBC 8.9 11/22/2018   HGB 15.1 11/22/2018   HCT 45.6 11/22/2018   MCV 87.2 11/22/2018   PLT 277 11/22/2018   Lab Results  Component Value Date   CREATININE 0.90 11/22/2018   BUN 9 11/22/2018   NA 137 11/22/2018   K 3.3 (L) 11/22/2018   CL 103  11/22/2018   CO2 23 11/22/2018   .lsatlft   Imaging Studies: CT CHEST LUNG CANCER SCREENING LOW DOSE WO CONTRAST  Result Date: 01/02/2020 CLINICAL DATA:  Lung cancer screening. Forty-eight pack-year history. Current asymptomatic smoker. EXAM: CT CHEST WITHOUT CONTRAST LOW-DOSE FOR LUNG CANCER SCREENING TECHNIQUE: Multidetector CT imaging of the chest was performed following the standard protocol without IV contrast. COMPARISON:  12/01/2018 FINDINGS: Cardiovascular: The heart size appears normal. Aortic atherosclerosis. Mediastinum/Nodes: No enlarged mediastinal, hilar, or axillary lymph nodes. Thyroid gland, trachea, and esophagus demonstrate no significant findings. Lungs/Pleura: No pleural effusion identified. There is no airspace consolidation, atelectasis, or pneumothorax. Mild changes of emphysema. Several small pulmonary nodules are again noted. The largest lung nodule is in the subpleural right middle lobe with an equivalent diameter of 5.5 mm. No new suspicious lung nodules. Upper Abdomen: No acute abnormality. Musculoskeletal: No chest wall mass or suspicious bone lesions identified. IMPRESSION: 1. Lung-RADS 2, benign appearance or behavior. Continue annual screening with low-dose chest CT without contrast in 12 months. 2. Emphysema and aortic  atherosclerosis. Aortic Atherosclerosis (ICD10-I70.0) and Emphysema (ICD10-J43.9). Electronically Signed   By: Kerby Moors M.D.   On: 01/02/2020 08:34

## 2020-01-10 DIAGNOSIS — I10 Essential (primary) hypertension: Secondary | ICD-10-CM | POA: Diagnosis not present

## 2020-01-10 DIAGNOSIS — E785 Hyperlipidemia, unspecified: Secondary | ICD-10-CM | POA: Diagnosis not present

## 2020-01-10 DIAGNOSIS — M4807 Spinal stenosis, lumbosacral region: Secondary | ICD-10-CM | POA: Diagnosis not present

## 2020-02-09 DIAGNOSIS — M4807 Spinal stenosis, lumbosacral region: Secondary | ICD-10-CM | POA: Diagnosis not present

## 2020-02-09 DIAGNOSIS — E785 Hyperlipidemia, unspecified: Secondary | ICD-10-CM | POA: Diagnosis not present

## 2020-02-09 DIAGNOSIS — I10 Essential (primary) hypertension: Secondary | ICD-10-CM | POA: Diagnosis not present

## 2020-02-29 IMAGING — DX DG CHEST 2V
2 series · 2 of 2 positions shown · non-contrast
Comparison: 09/05/2008

CLINICAL DATA: Cough, chest pain, shortness of breath

EXAM:
CHEST - 2 VIEW

[chest pa]
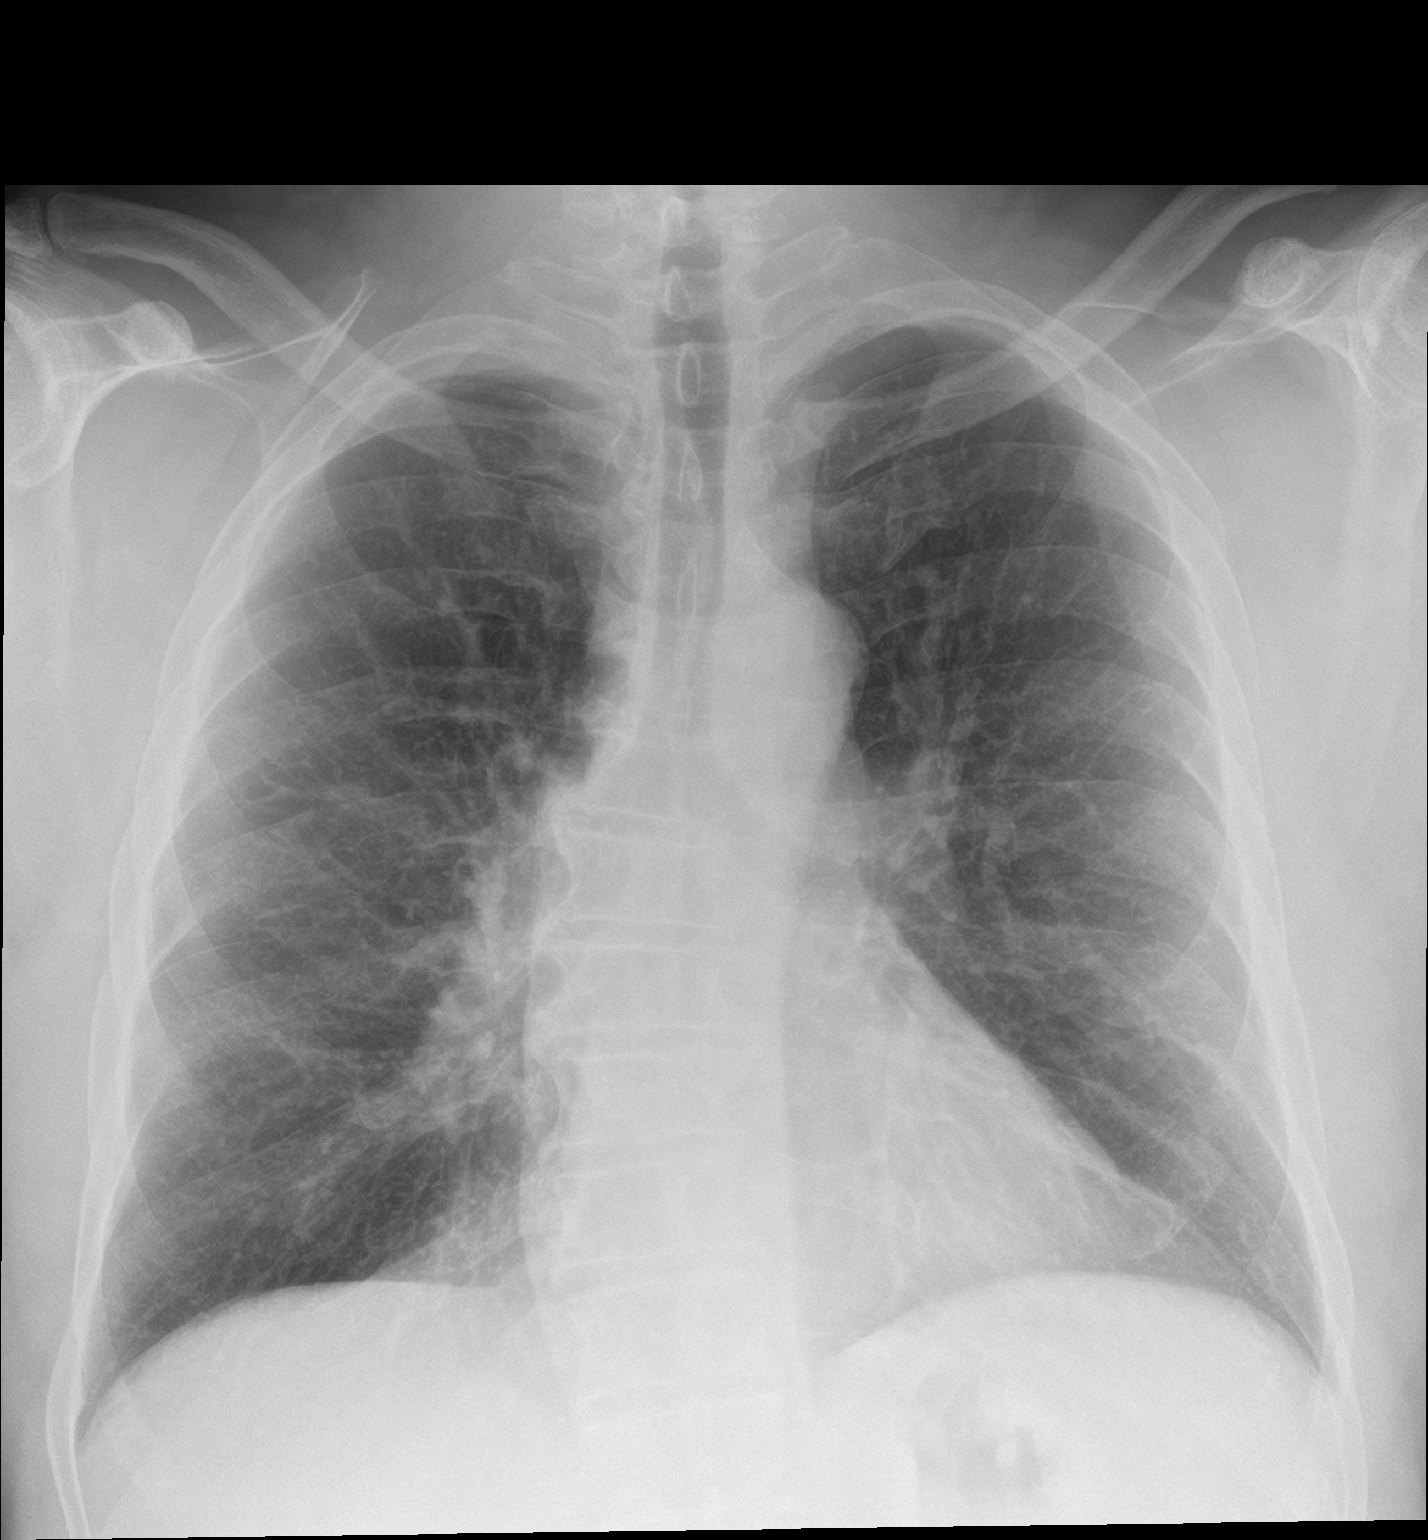

[chest lat]
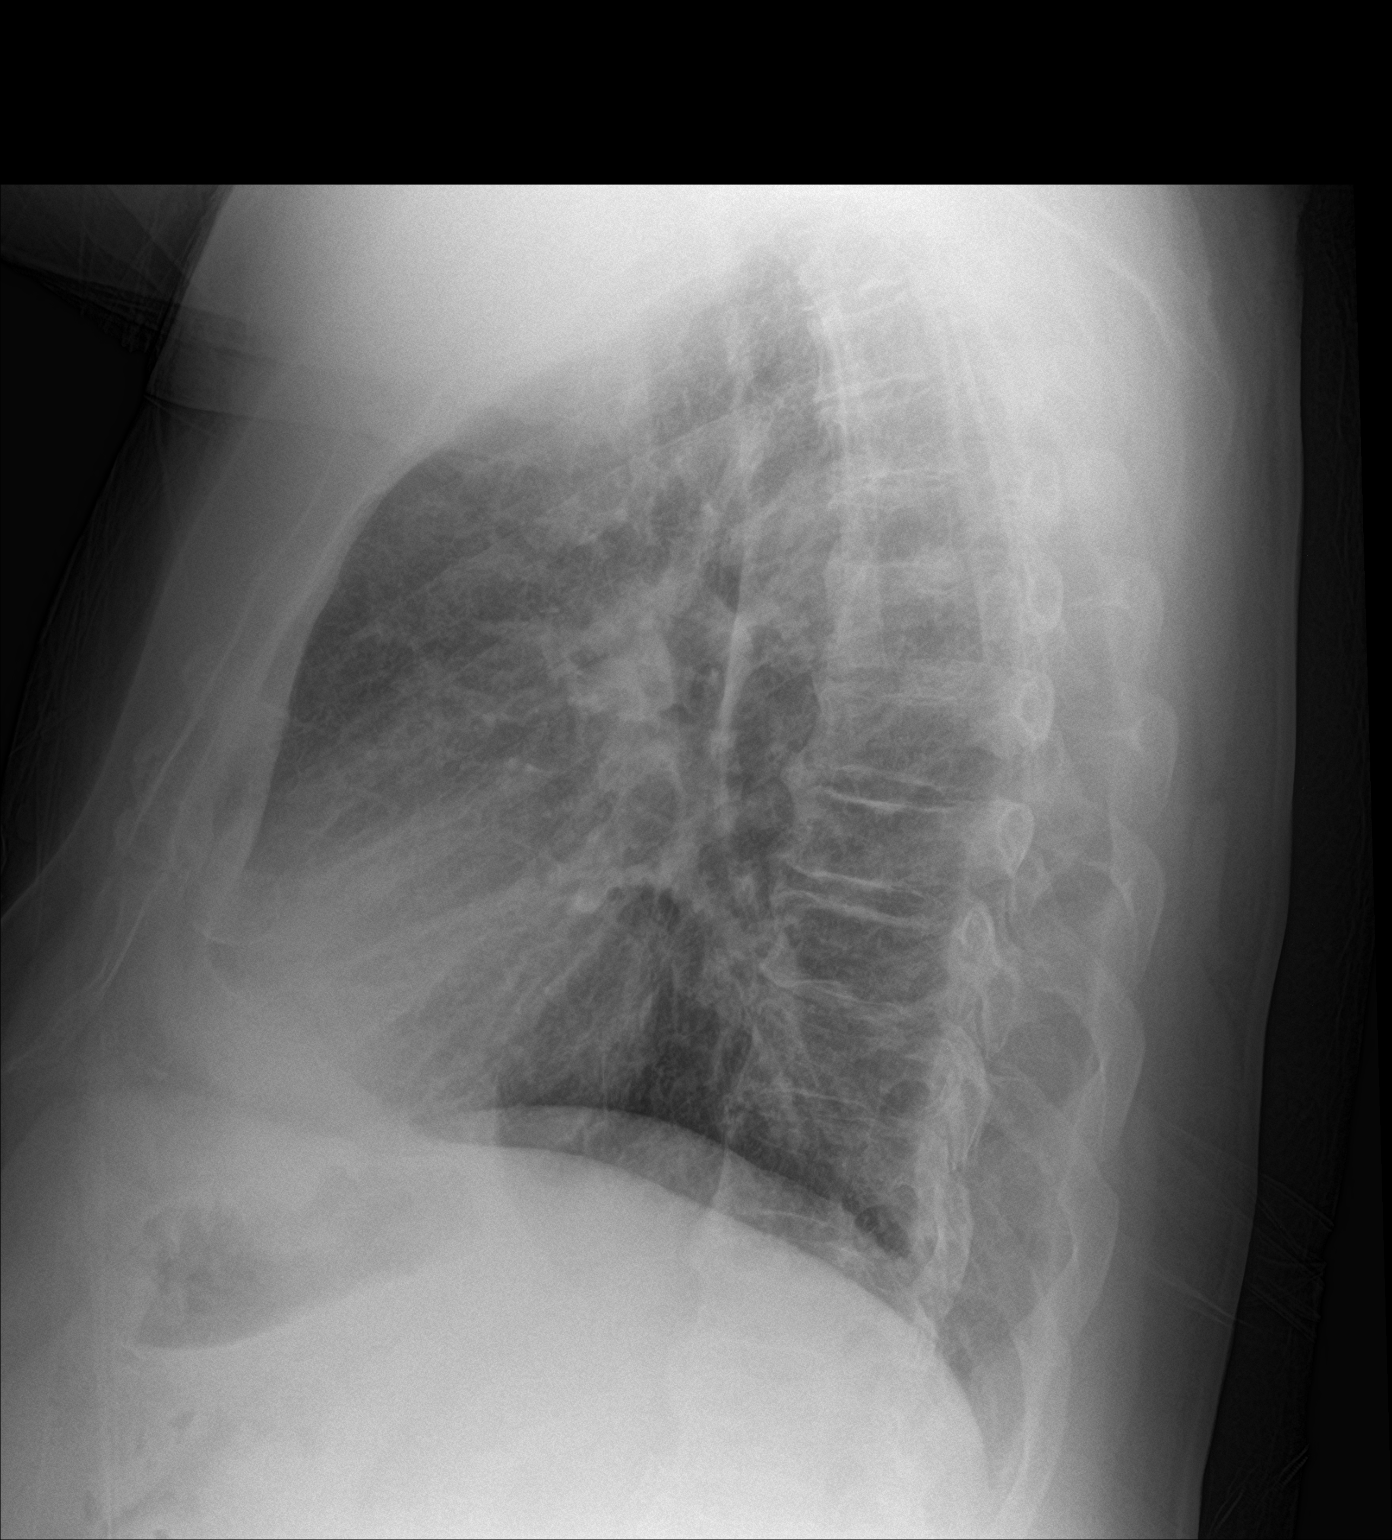

[2 of 2 positions shown; findings below may reference images not displayed]

FINDINGS: Mild peribronchial thickening. Heart and mediastinal contours are
within normal limits. No focal opacities or effusions. No acute bony
abnormality.
IMPRESSION: Mild bronchitic changes.

## 2020-03-05 ENCOUNTER — Telehealth: Payer: Self-pay | Admitting: Internal Medicine

## 2020-03-05 NOTE — Telephone Encounter (Signed)
Patient wants to reschedule his procedure   Please call his cell

## 2020-03-05 NOTE — Telephone Encounter (Signed)
Called pt, he will be going out of town and will not be back until 04/02/20. He didn't want to have procedure 04/04/20 as it's too close to him coming back home. Informed him we will call to reschedule when August schedule is received. LMOVM for endo scheduler to cancel procedure.

## 2020-03-11 DIAGNOSIS — E785 Hyperlipidemia, unspecified: Secondary | ICD-10-CM | POA: Diagnosis not present

## 2020-03-11 DIAGNOSIS — Z720 Tobacco use: Secondary | ICD-10-CM | POA: Diagnosis not present

## 2020-03-11 DIAGNOSIS — M4807 Spinal stenosis, lumbosacral region: Secondary | ICD-10-CM | POA: Diagnosis not present

## 2020-03-11 DIAGNOSIS — I10 Essential (primary) hypertension: Secondary | ICD-10-CM | POA: Diagnosis not present

## 2020-04-02 ENCOUNTER — Other Ambulatory Visit (HOSPITAL_COMMUNITY): Payer: Medicare Other

## 2020-04-04 ENCOUNTER — Encounter (HOSPITAL_COMMUNITY): Payer: Self-pay

## 2020-04-04 ENCOUNTER — Ambulatory Visit (HOSPITAL_COMMUNITY): Admit: 2020-04-04 | Payer: Medicare Other | Admitting: Internal Medicine

## 2020-04-04 SURGERY — COLONOSCOPY WITH PROPOFOL
Anesthesia: Monitor Anesthesia Care

## 2020-04-08 ENCOUNTER — Telehealth: Payer: Self-pay | Admitting: Internal Medicine

## 2020-04-08 NOTE — Telephone Encounter (Signed)
noted 

## 2020-04-08 NOTE — Telephone Encounter (Signed)
Pt is aware that we will call him to schedule his colonoscopy once we get the schedule for August. [phone]

## 2020-04-15 DIAGNOSIS — I10 Essential (primary) hypertension: Secondary | ICD-10-CM | POA: Diagnosis not present

## 2020-04-15 DIAGNOSIS — E785 Hyperlipidemia, unspecified: Secondary | ICD-10-CM | POA: Diagnosis not present

## 2020-04-15 DIAGNOSIS — J439 Emphysema, unspecified: Secondary | ICD-10-CM | POA: Diagnosis not present

## 2020-04-22 ENCOUNTER — Telehealth: Payer: Self-pay

## 2020-04-22 NOTE — Telephone Encounter (Signed)
Please advise ASA for TCS w/Propofol.

## 2020-04-23 ENCOUNTER — Telehealth: Payer: Self-pay | Admitting: Internal Medicine

## 2020-04-23 NOTE — Telephone Encounter (Signed)
Spoke to pt, TCS w/Prop rescheduled w/Dr. Abbey Chatters (previous [facility] pt) for 06/04/20 at 11:15am. He didn't pick up prep. He is aware he will receive instructions for Miralax prep. Orders entered.

## 2020-04-23 NOTE — Telephone Encounter (Signed)
Letter mailed

## 2020-04-23 NOTE — Telephone Encounter (Signed)
Pt returning call to MB. [phone] or [phone]

## 2020-04-23 NOTE — Telephone Encounter (Signed)
Tried to call pt to reschedule TCS, no answer, LMOVM for return call.

## 2020-04-23 NOTE — Telephone Encounter (Signed)
See other phone note

## 2020-04-23 NOTE — Telephone Encounter (Signed)
ASA III 

## 2020-04-24 NOTE — Telephone Encounter (Signed)
Pre-op/covid test 05/31/20. Letter mailed with procedure instructions.

## 2020-05-30 NOTE — Patient Instructions (Signed)
[name]  05/30/2020     @PREFPERIOPPHARMACY @   Your procedure is scheduled on  06/04/2020  Report to [name] at  [facility].  Call this number if you have problems the morning of surgery:  [phone]   Remember:  Follow the diet and prep instructions given to you by the office.                      Take these medicines the morning of surgery with A SIP OF WATER  Amlodipine, nexium.    Do not wear jewelry, make-up or nail polish.  Do not wear lotions, powders, or perfumes. Please wear deodorant and brush your teeth.  Do not shave 48 hours prior to surgery.  Men may shave face and neck.  Do not bring valuables to the hospital.  [facility] is not responsible for any belongings or valuables.  Contacts, dentures or bridgework may not be worn into surgery.  Leave your suitcase in the car.  After surgery it may be brought to your room.  For patients admitted to the hospital, discharge time will be determined by your treatment team.  Patients discharged the day of surgery will not be allowed to drive home.   Name and phone number of your driver:   family Special instructions:   DO NOT smoke the morning of your procedure.  Please read over the following fact sheets that you were given. Anesthesia Post-op Instructions and Care and Recovery After Surgery       Colonoscopy, Adult, Care After This sheet gives you information about how to care for yourself after your procedure. Your health care provider may also give you more specific instructions. If you have problems or questions, contact your health care provider. What can I expect after the procedure? After the procedure, it is common to have:  A small amount of blood in your stool for 24 hours after the procedure.  Some gas.  Mild cramping or bloating of your abdomen. Follow these instructions at home: Eating and drinking   Drink enough fluid to keep your urine pale yellow.  Follow instructions  from your health care provider about eating or drinking restrictions.  Resume your normal diet as instructed by your health care provider. Avoid heavy or fried foods that are hard to digest. Activity  Rest as told by your health care provider.  Avoid sitting for a long time without moving. Get up to take short walks every 1-2 hours. This is important to improve blood flow and breathing. Ask for help if you feel weak or unsteady.  Return to your normal activities as told by your health care provider. Ask your health care provider what activities are safe for you. Managing cramping and bloating   Try walking around when you have cramps or feel bloated.  Apply heat to your abdomen as told by your health care provider. Use the heat source that your health care provider recommends, such as a moist heat pack or a heating pad. ? Place a towel between your skin and the heat source. ? Leave the heat on for 20-30 minutes. ? Remove the heat if your skin turns bright red. This is especially important if you are unable to feel pain, heat, or cold. You may have a greater risk of getting burned. General instructions  For the first 24 hours after the procedure: ? Do not drive or use machinery. ? Do not sign  important documents. ? Do not drink alcohol. ? Do your regular daily activities at a slower pace than normal. ? Eat soft foods that are easy to digest.  Take over-the-counter and prescription medicines only as told by your health care provider.  Keep all follow-up visits as told by your health care provider. This is important. Contact a health care provider if:  You have blood in your stool 2-3 days after the procedure. Get help right away if you have:  More than a small spotting of blood in your stool.  Large blood clots in your stool.  Swelling of your abdomen.  Nausea or vomiting.  A fever.  Increasing pain in your abdomen that is not relieved with medicine. Summary  After the  procedure, it is common to have a small amount of blood in your stool. You may also have mild cramping and bloating of your abdomen.  For the first 24 hours after the procedure, do not drive or use machinery, sign important documents, or drink alcohol.  Get help right away if you have a lot of blood in your stool, nausea or vomiting, a fever, or increased pain in your abdomen. This information is not intended to replace advice given to you by your health care provider. Make sure you discuss any questions you have with your health care provider. Document Revised: 04/24/2019 Document Reviewed: 04/24/2019 Elsevier Patient Education  [place] After These instructions provide you with information about caring for yourself after your procedure. Your health care provider may also give you more specific instructions. Your treatment has been planned according to current medical practices, but problems sometimes occur. Call your health care provider if you have any problems or questions after your procedure. What can I expect after the procedure? After your procedure, you may:  Feel sleepy for several hours.  Feel clumsy and have poor balance for several hours.  Feel forgetful about what happened after the procedure.  Have poor judgment for several hours.  Feel nauseous or vomit.  Have a sore throat if you had a breathing tube during the procedure. Follow these instructions at home: For at least 24 hours after the procedure:      Have a responsible adult stay with you. It is important to have someone help care for you until you are awake and alert.  Rest as needed.  Do not: ? Participate in activities in which you could fall or become injured. ? Drive. ? Use heavy machinery. ? Drink alcohol. ? Take sleeping pills or medicines that cause drowsiness. ? Make important decisions or sign legal documents. ? Take care of children on your own. Eating  and drinking  Follow the diet that is recommended by your health care provider.  If you vomit, drink water, juice, or soup when you can drink without vomiting.  Make sure you have little or no nausea before eating solid foods. General instructions  Take over-the-counter and prescription medicines only as told by your health care provider.  If you have sleep apnea, surgery and certain medicines can increase your risk for breathing problems. Follow instructions from your health care provider about wearing your sleep device: ? Anytime you are sleeping, including during daytime naps. ? While taking prescription pain medicines, sleeping medicines, or medicines that make you drowsy.  If you smoke, do not smoke without supervision.  Keep all follow-up visits as told by your health care provider. This is important. Contact a health care provider if:  You keep feeling nauseous or you keep vomiting.  You feel light-headed.  You develop a rash.  You have a fever. Get help right away if:  You have trouble breathing. Summary  For several hours after your procedure, you may feel sleepy and have poor judgment.  Have a responsible adult stay with you for at least 24 hours or until you are awake and alert. This information is not intended to replace advice given to you by your health care provider. Make sure you discuss any questions you have with your health care provider. Document Revised: 12/27/2017 Document Reviewed: 01/19/2016 Elsevier Patient Education  [place].

## 2020-05-31 ENCOUNTER — Encounter (HOSPITAL_COMMUNITY): Payer: Self-pay

## 2020-05-31 ENCOUNTER — Other Ambulatory Visit (HOSPITAL_COMMUNITY)
Admission: RE | Admit: 2020-05-31 | Discharge: 2020-05-31 | Disposition: A | Discharge status: Home / Self Care | Payer: Medicare Other | Source: Ambulatory Visit | Attending: Internal Medicine | Admitting: Internal Medicine

## 2020-05-31 ENCOUNTER — Other Ambulatory Visit: Payer: Self-pay

## 2020-05-31 ENCOUNTER — Encounter (HOSPITAL_COMMUNITY)
Admission: RE | Admit: 2020-05-31 | Discharge: 2020-05-31 | Disposition: A | Discharge status: Home / Self Care | Payer: Medicare Other | Source: Ambulatory Visit | Attending: Internal Medicine | Admitting: Internal Medicine

## 2020-05-31 DIAGNOSIS — Z20822 Contact with and (suspected) exposure to covid-19: Secondary | ICD-10-CM | POA: Insufficient documentation

## 2020-05-31 DIAGNOSIS — Z01818 Encounter for other preprocedural examination: Secondary | ICD-10-CM | POA: Diagnosis not present

## 2020-05-31 HISTORY — DX: Essential (primary) hypertension: I10

## 2020-05-31 LAB — COMPREHENSIVE METABOLIC PANEL
ALT: 19 U/L (ref 0–44)
AST: 21 U/L (ref 15–41)
Albumin: 4.1 g/dL (ref 3.5–5.0)
Alkaline Phosphatase: 55 U/L (ref 38–126)
Anion gap: 10 (ref 5–15)
BUN: 13 mg/dL (ref 8–23)
CO2: 24 mmol/L (ref 22–32)
Calcium: 9 mg/dL (ref 8.9–10.3)
Chloride: 104 mmol/L (ref 98–111)
Creatinine, Ser: 0.83 mg/dL (ref 0.61–1.24)
GFR calc Af Amer: >60 mL/min (ref >60)
GFR calc non Af Amer: >60 mL/min (ref >60)
Glucose, Bld: 153 mg/dL — ABNORMAL HIGH (ref 70–99)
Potassium: 3.5 mmol/L (ref 3.5–5.1)
Sodium: 138 mmol/L (ref 135–145)
Total Bilirubin: 0.5 mg/dL (ref 0.3–1.2)
Total Protein: 7.3 g/dL (ref 6.5–8.1)

## 2020-05-31 LAB — CBC WITH DIFFERENTIAL/PLATELET
Abs Immature Granulocytes: 0.03 10*3/uL (ref 0.00–0.07)
Basophils Absolute: 0.1 10*3/uL (ref 0.0–0.1)
Basophils Relative: 1 %
Eosinophils Absolute: 0.3 10*3/uL (ref 0.0–0.5)
Eosinophils Relative: 3 %
HCT: 45 % (ref 39.0–52.0)
Hemoglobin: 15.4 g/dL (ref 13.0–17.0)
Immature Granulocytes: 0 %
Lymphocytes Relative: 29 %
Lymphs Abs: 2.5 10*3/uL (ref 0.7–4.0)
MCH: 30.2 pg (ref 26.0–34.0)
MCHC: 34.2 g/dL (ref 30.0–36.0)
MCV: 88.2 fL (ref 80.0–100.0)
Monocytes Absolute: 0.9 10*3/uL (ref 0.1–1.0)
Monocytes Relative: 11 %
Neutro Abs: 4.7 10*3/uL (ref 1.7–7.7)
Neutrophils Relative %: 56 %
Platelets: 301 10*3/uL (ref 150–400)
RBC: 5.1 MIL/uL (ref 4.22–5.81)
RDW: 12.4 % (ref 11.5–15.5)
WBC: 8.4 10*3/uL (ref 4.0–10.5)
nRBC: 0 % (ref 0.0–0.2)

## 2020-05-31 LAB — PROTIME-INR
INR: 1 (ref 0.8–1.2)
Prothrombin Time: 12.4 seconds (ref 11.4–15.2)

## 2020-05-31 LAB — SARS CORONAVIRUS 2 (TAT 6-24 HRS): SARS Coronavirus 2: NEGATIVE

## 2020-06-04 ENCOUNTER — Ambulatory Visit (HOSPITAL_COMMUNITY)
Admission: RE | Admit: 2020-06-04 | Discharge: 2020-06-04 | Disposition: A | Discharge status: Home / Self Care | Payer: Medicare Other | Attending: Internal Medicine | Admitting: Internal Medicine

## 2020-06-04 ENCOUNTER — Ambulatory Visit (HOSPITAL_COMMUNITY): Payer: Medicare Other | Admitting: Anesthesiology

## 2020-06-04 ENCOUNTER — Encounter (HOSPITAL_COMMUNITY): Payer: Self-pay

## 2020-06-04 ENCOUNTER — Encounter (HOSPITAL_COMMUNITY)
Admission: RE | Disposition: A | Discharge status: Home / Self Care | Payer: Self-pay | Source: Home / Self Care | Attending: Internal Medicine

## 2020-06-04 DIAGNOSIS — Z09 Encounter for follow-up examination after completed treatment for conditions other than malignant neoplasm: Secondary | ICD-10-CM | POA: Diagnosis not present

## 2020-06-04 DIAGNOSIS — M1990 Unspecified osteoarthritis, unspecified site: Secondary | ICD-10-CM | POA: Diagnosis not present

## 2020-06-04 DIAGNOSIS — Z96652 Presence of left artificial knee joint: Secondary | ICD-10-CM | POA: Insufficient documentation

## 2020-06-04 DIAGNOSIS — K635 Polyp of colon: Secondary | ICD-10-CM

## 2020-06-04 DIAGNOSIS — K219 Gastro-esophageal reflux disease without esophagitis: Secondary | ICD-10-CM | POA: Diagnosis not present

## 2020-06-04 DIAGNOSIS — I10 Essential (primary) hypertension: Secondary | ICD-10-CM | POA: Diagnosis not present

## 2020-06-04 DIAGNOSIS — K5730 Diverticulosis of large intestine without perforation or abscess without bleeding: Secondary | ICD-10-CM | POA: Insufficient documentation

## 2020-06-04 DIAGNOSIS — Z86010 Personal history of colonic polyps: Secondary | ICD-10-CM

## 2020-06-04 DIAGNOSIS — Z8619 Personal history of other infectious and parasitic diseases: Secondary | ICD-10-CM | POA: Insufficient documentation

## 2020-06-04 DIAGNOSIS — J45909 Unspecified asthma, uncomplicated: Secondary | ICD-10-CM | POA: Insufficient documentation

## 2020-06-04 DIAGNOSIS — Z1211 Encounter for screening for malignant neoplasm of colon: Secondary | ICD-10-CM | POA: Insufficient documentation

## 2020-06-04 DIAGNOSIS — Z7982 Long term (current) use of aspirin: Secondary | ICD-10-CM | POA: Diagnosis not present

## 2020-06-04 DIAGNOSIS — Z79899 Other long term (current) drug therapy: Secondary | ICD-10-CM | POA: Diagnosis not present

## 2020-06-04 DIAGNOSIS — F17210 Nicotine dependence, cigarettes, uncomplicated: Secondary | ICD-10-CM | POA: Diagnosis not present

## 2020-06-04 DIAGNOSIS — Z791 Long term (current) use of non-steroidal anti-inflammatories (NSAID): Secondary | ICD-10-CM | POA: Insufficient documentation

## 2020-06-04 DIAGNOSIS — K648 Other hemorrhoids: Secondary | ICD-10-CM | POA: Insufficient documentation

## 2020-06-04 DIAGNOSIS — D120 Benign neoplasm of cecum: Secondary | ICD-10-CM | POA: Diagnosis not present

## 2020-06-04 DIAGNOSIS — D124 Benign neoplasm of descending colon: Secondary | ICD-10-CM | POA: Insufficient documentation

## 2020-06-04 HISTORY — PX: COLONOSCOPY WITH PROPOFOL: SHX5780

## 2020-06-04 HISTORY — PX: POLYPECTOMY: SHX5525

## 2020-06-04 SURGERY — COLONOSCOPY WITH PROPOFOL
Anesthesia: General

## 2020-06-04 MED ORDER — KETAMINE HCL 10 MG/ML IJ SOLN
INTRAMUSCULAR | Status: DC | PRN
  Administered 2020-06-04: 20 mg via INTRAVENOUS

## 2020-06-04 MED ORDER — PROPOFOL 10 MG/ML IV BOLUS
INTRAVENOUS | Status: DC | PRN
  Administered 2020-06-04: 50 mg via INTRAVENOUS

## 2020-06-04 MED ORDER — LACTATED RINGERS IV SOLN: INTRAVENOUS | Status: DC

## 2020-06-04 MED ORDER — STERILE WATER FOR IRRIGATION IR SOLN
Status: DC | PRN
  Administered 2020-06-04: 1.5 mL

## 2020-06-04 MED ORDER — LIDOCAINE 2% (20 MG/ML) 5 ML SYRINGE
INTRAMUSCULAR | Status: AC
  Filled 2020-06-04: qty 10

## 2020-06-04 MED ORDER — PROPOFOL 500 MG/50ML IV EMUL
INTRAVENOUS | Status: DC | PRN
  Administered 2020-06-04: 125 ug/kg/min via INTRAVENOUS

## 2020-06-04 MED ORDER — KETAMINE HCL 50 MG/5ML IJ SOSY
PREFILLED_SYRINGE | INTRAMUSCULAR | Status: AC
  Filled 2020-06-04: qty 5

## 2020-06-04 NOTE — Discharge Instructions (Addendum)
Colonoscopy Discharge Instructions  Read the instructions outlined below and refer to this sheet in the next few weeks. These discharge instructions provide you with general information on caring for yourself after you leave the hospital. Your doctor may also give you specific instructions. While your treatment has been planned according to the most current medical practices available, unavoidable complications occasionally occur.   ACTIVITY  You may resume your regular activity, but move at a slower pace for the next 24 hours.   Take frequent rest periods for the next 24 hours.   Walking will help get rid of the air and reduce the bloated feeling in your belly (abdomen).   No driving for 24 hours (because of the medicine (anesthesia) used during the test).    Do not sign any important legal documents or operate any machinery for 24 hours (because of the anesthesia used during the test).  NUTRITION  Drink plenty of fluids.   You may resume your normal diet as instructed by your doctor.   Begin with a light meal and progress to your normal diet. Heavy or fried foods are harder to digest and may make you feel sick to your stomach (nauseated).   Avoid alcoholic beverages for 24 hours or as instructed.  MEDICATIONS  You may resume your normal medications unless your doctor tells you otherwise.  WHAT YOU CAN EXPECT TODAY  Some feelings of bloating in the abdomen.   Passage of more gas than usual.   Spotting of blood in your stool or on the toilet paper.  IF YOU HAD POLYPS REMOVED DURING THE COLONOSCOPY:  No aspirin products for 7 days or as instructed.   No alcohol for 7 days or as instructed.   Eat a soft diet for the next 24 hours.  FINDING OUT THE RESULTS OF YOUR TEST Not all test results are available during your visit. If your test results are not back during the visit, make an appointment with your caregiver to find out the results. Do not assume everything is normal if  you have not heard from your caregiver or the medical facility. It is important for you to follow up on all of your test results.  SEEK IMMEDIATE MEDICAL ATTENTION IF:  You have more than a spotting of blood in your stool.   Your belly is swollen (abdominal distention).   You are nauseated or vomiting.   You have a temperature over 101.   You have abdominal pain or discomfort that is severe or gets worse throughout the day.   Your colonoscopy showed 3 polyps which I removed successfully.  Await pathology results (my office will contact you).  I recommend we repeat colonoscopy in 3 years with a 2-day extended prep.  Follow-up with GI as needed.  I hope you have a great rest of your week!  Diverticulosis  Diverticulosis is a condition that develops when small pouches (diverticula) form in the wall of the large intestine (colon). The colon is where water is absorbed and stool (feces) is formed. The pouches form when the inside layer of the colon pushes through weak spots in the outer layers of the colon. You may have a few pouches or many of them. The pouches usually do not cause problems unless they become inflamed or infected. When this happens, the condition is called diverticulitis. What are the causes? The cause of this condition is not known. What increases the risk? The following factors may make you more likely to develop this condition:  Being older than age 78. Your risk for this condition increases with age. Diverticulosis is rare among people younger than age 13. By age 28, many people have it.  Eating a low-fiber diet.  Having frequent constipation.  Being overweight.  Not getting enough exercise.  Smoking.  Taking over-the-counter pain medicines, like aspirin and ibuprofen.  Having a family history of diverticulosis. What are the signs or symptoms? In most people, there are no symptoms of this condition. If you do have symptoms, they may  include:  Bloating.  Cramps in the abdomen.  Constipation or diarrhea.  Pain in the lower left side of the abdomen. How is this diagnosed? Because diverticulosis usually has no symptoms, it is most often diagnosed during an exam for other colon problems. The condition may be diagnosed by:  Using a flexible scope to examine the colon (colonoscopy).  Taking an X-ray of the colon after dye has been put into the colon (barium enema).  Having a CT scan. How is this treated? You may not need treatment for this condition. Your health care provider may recommend treatment to prevent problems. You may need treatment if you have symptoms or if you previously had diverticulitis. Treatment may include:  Eating a high-fiber diet.  Taking a fiber supplement.  Taking a live bacteria supplement (probiotic).  Taking medicine to relax your colon. Follow these instructions at home: Medicines  Take over-the-counter and prescription medicines only as told by your health care provider.  If told by your health care provider, take a fiber supplement or probiotic. Constipation prevention Your condition may cause constipation. To prevent or treat constipation, you may need to:  Drink enough fluid to keep your urine pale yellow.  Take over-the-counter or prescription medicines.  Eat foods that are high in fiber, such as beans, whole grains, and fresh fruits and vegetables.  Limit foods that are high in fat and processed sugars, such as fried or sweet foods.  General instructions  Try not to strain when you have a bowel movement.  Keep all follow-up visits as told by your health care provider. This is important. Contact a health care provider if you:  Have pain in your abdomen.  Have bloating.  Have cramps.  Have not had a bowel movement in 3 days. Get help right away if:  Your pain gets worse.  Your bloating becomes very bad.  You have a fever or chills, and your symptoms  suddenly get worse.  You vomit.  You have bowel movements that are bloody or black.  You have bleeding from your rectum. Summary  Diverticulosis is a condition that develops when small pouches (diverticula) form in the wall of the large intestine (colon).  You may have a few pouches or many of them.  This condition is most often diagnosed during an exam for other colon problems.  Treatment may include increasing the fiber in your diet, taking supplements, or taking medicines. This information is not intended to replace advice given to you by your health care provider. Make sure you discuss any questions you have with your health care provider. Document Revised: 04/27/2019 Document Reviewed: 04/27/2019 Elsevier Patient Education  [place].  Colon Polyps  Polyps are tissue growths inside the body. Polyps can grow in many places, including the large intestine (colon). A polyp may be a round bump or a mushroom-shaped growth. You could have one polyp or several. Most colon polyps are noncancerous (benign). However, some colon polyps can become cancerous over time.  Finding and removing the polyps early can help prevent this. What are the causes? The exact cause of colon polyps is not known. What increases the risk? You are more likely to develop this condition if you:  Have a family history of colon cancer or colon polyps.  Are older than 12 or older than 45 if you are African American.  Have inflammatory bowel disease, such as ulcerative colitis or Crohn's disease.  Have certain hereditary conditions, such as: ? Familial adenomatous polyposis. ? Lynch syndrome. ? Turcot syndrome. ? Peutz-Jeghers syndrome.  Are overweight.  Smoke cigarettes.  Do not get enough exercise.  Drink too much alcohol.  Eat a diet that is high in fat and red meat and low in fiber.  Had childhood cancer that was treated with abdominal radiation. What are the signs or symptoms? Most  polyps do not cause symptoms. If you have symptoms, they may include:  Blood coming from your rectum when having a bowel movement.  Blood in your stool. The stool may look dark red or black.  Abdominal pain.  A change in bowel habits, such as constipation or diarrhea. How is this diagnosed? This condition is diagnosed with a colonoscopy. This is a procedure in which a lighted, flexible scope is inserted into the anus and then passed into the colon to examine the area. Polyps are sometimes found when a colonoscopy is done as part of routine cancer screening tests. How is this treated? Treatment for this condition involves removing any polyps that are found. Most polyps can be removed during a colonoscopy. Those polyps will then be tested for cancer. Additional treatment may be needed depending on the results of testing. Follow these instructions at home: Lifestyle  Maintain a healthy weight, or lose weight if recommended by your health care provider.  Exercise every day or as told by your health care provider.  Do not use any products that contain nicotine or tobacco, such as cigarettes and e-cigarettes. If you need help quitting, ask your health care provider.  If you drink alcohol, limit how much you have: ? 0-1 drink a day for women. ? 0-2 drinks a day for men.  Be aware of how much alcohol is in your drink. In the [place] one drink equals one 12 oz bottle of beer (355 mL), one 5 oz glass of wine (148 mL), or one 1 oz shot of hard liquor (44 mL). Eating and drinking   Eat foods that are high in fiber, such as fruits, vegetables, and whole grains.  Eat foods that are high in calcium and vitamin D, such as milk, cheese, yogurt, eggs, liver, fish, and broccoli.  Limit foods that are high in fat, such as fried foods and desserts.  Limit the amount of red meat and processed meat you eat, such as hot dogs, sausage, bacon, and lunch meats. General instructions  Keep all follow-up  visits as told by your health care provider. This is important. ? This includes having regularly scheduled colonoscopies. ? Talk to your health care provider about when you need a colonoscopy. Contact a health care provider if:  You have new or worsening bleeding during a bowel movement.  You have new or increased blood in your stool.  You have a change in bowel habits.  You lose weight for no known reason. Summary  Polyps are tissue growths inside the body. Polyps can grow in many places, including the colon.  Most colon polyps are noncancerous (benign), but some can become  cancerous over time.  This condition is diagnosed with a colonoscopy.  Treatment for this condition involves removing any polyps that are found. Most polyps can be removed during a colonoscopy. This information is not intended to replace advice given to you by your health care provider. Make sure you discuss any questions you have with your health care provider. Document Revised: 01/13/2018 Document Reviewed: 01/13/2018 Elsevier Patient Education  [place].

## 2020-06-04 NOTE — Op Note (Signed)
[facility] Patient Name: [name] Procedure Date: 06/04/2020 10:14 AM MRN: [id number] Date of Birth: [birth year] Attending MD: Elon Alas. Edgar Frisk CSN: [id number] Age: 64 Admit Type: Outpatient Procedure:                Colonoscopy Indications:              High risk colon cancer surveillance: Personal                            history of colonic polyps Providers:                Elon Alas. Usama Harkless, DO, Otis Peak B. Sharon Seller, RN,                            Caprice Kluver, Casimer Bilis, Technician, Aram Candela Referring MD:              Medicines:                See the Anesthesia note for documentation of the                            administered medications Complications:            No immediate complications. Estimated Blood Loss:     Estimated blood loss was minimal. Procedure:                Pre-Anesthesia Assessment:                           - The anesthesia plan was to use monitored                            anesthesia care (MAC).                           After obtaining informed consent, the colonoscope                            was passed under direct vision. Throughout the                            procedure, the patient's blood pressure, pulse, and                            oxygen saturations were monitored continuously. The                            PCF-H190DL ([id number]) was introduced through the                            anus and advanced to the the cecum, identified by                            appendiceal orifice and ileocecal valve. The  colonoscopy was performed without difficulty. The                            patient tolerated the procedure well. The quality                            of the bowel preparation was evaluated using the                            BBPS [facility] Bowel Preparation Scale) with scores                            of: Right Colon = 2 (minor amount of residual                             staining, small fragments of stool and/or opaque                            liquid, but mucosa seen well), Transverse Colon = 2                            (minor amount of residual staining, small fragments                            of stool and/or opaque liquid, but mucosa seen                            well) and Left Colon = 2 (minor amount of residual                            staining, small fragments of stool and/or opaque                            liquid, but mucosa seen well). The total BBPS score                            equals 6. The quality of the bowel preparation was                            fair. Scope In: 10:44:13 AM Scope Out: 11:02:12 AM Scope Withdrawal Time: 0 hours 15 minutes 36 seconds  Total Procedure Duration: 0 hours 17 minutes 59 seconds  Findings:      The perianal and digital rectal examinations were normal.      Non-bleeding internal hemorrhoids were found during endoscopy.      Multiple small-mouthed diverticula were found in the sigmoid colon and       descending colon.      A 2 mm polyp was found in the cecum. The polyp was sessile. The polyp       was removed with a jumbo cold forceps. Resection and retrieval were       complete.      A 5 mm polyp was found in the descending colon. The polyp was sessile.  The polyp was removed with a cold snare. Resection and retrieval were       complete.      A 3 mm polyp was found in the descending colon. The polyp was sessile.       The polyp was removed with a cold snare. Resection and retrieval were       complete.      A moderate amount of stool was found in the entire colon, making       visualization difficult. Lavage of the area was performed using copious       amounts, resulting in clearance with fair visualization. Impression:               - Preparation of the colon was fair.                           - Non-bleeding internal hemorrhoids.                           -  Diverticulosis in the sigmoid colon and in the                            descending colon.                           - One 2 mm polyp in the cecum, removed with a jumbo                            cold forceps. Resected and retrieved.                           - One 5 mm polyp in the descending colon, removed                            with a cold snare. Resected and retrieved.                           - One 3 mm polyp in the descending colon, removed                            with a cold snare. Resected and retrieved.                           - Stool in the entire examined colon. Moderate Sedation:      Per Anesthesia Care Recommendation:           - Patient has a contact number available for                            emergencies. The signs and symptoms of potential                            delayed complications were discussed with the                            patient. Return to normal activities tomorrow.  Written discharge instructions were provided to the                            patient.                           - Resume previous diet.                           - Continue present medications.                           - Await pathology results.                           - Repeat colonoscopy in 3 years for surveillance                            and for borderline colon prep.                           - Return to GI clinic PRN. Procedure Code(s):        --- Professional ---                           [phone], Colonoscopy, flexible; with removal of                            tumor(s), polyp(s), or other lesion(s) by snare                            technique                           45380, 52, Colonoscopy, flexible; with biopsy,                            single or multiple Diagnosis Code(s):        --- Professional ---                           Z86.010, Personal history of colonic polyps                           K64.8, Other hemorrhoids                            K63.5, Polyp of colon                           K57.30, Diverticulosis of large intestine without                            perforation or abscess without bleeding CPT copyright 2019 American Medical Association. All rights reserved. The codes documented in this report are preliminary and upon coder review may  be revised to meet current compliance requirements. Elon Alas. Abbey Chatters, Brewster [place], DO 06/04/2020 11:10:26 AM  This report has been signed electronically. Number of Addenda: 0

## 2020-06-04 NOTE — Anesthesia Preprocedure Evaluation (Signed)
Anesthesia Evaluation  Patient identified by MRN, date of birth, ID band Patient awake    Reviewed: Allergy & Precautions, H&P , NPO status , Patient's Chart, lab work & pertinent test results, reviewed documented beta blocker date and time   Airway Mallampati: II  TM Distance: >3 FB Neck ROM: full    Dental no notable dental hx. (+) Teeth Intact   Pulmonary asthma , Current Smoker,    Pulmonary exam normal breath sounds clear to auscultation       Cardiovascular Exercise Tolerance: Good hypertension, negative cardio ROS   Rhythm:regular Rate:Normal     Neuro/Psych PSYCHIATRIC DISORDERS negative neurological ROS     GI/Hepatic GERD  Medicated,(+) Hepatitis -, C  Endo/Other  negative endocrine ROS  Renal/GU negative Renal ROS  negative genitourinary   Musculoskeletal   Abdominal   Peds  Hematology negative hematology ROS (+)   Anesthesia Other Findings   Reproductive/Obstetrics negative OB ROS                             Anesthesia Physical Anesthesia Plan  ASA: III  Anesthesia Plan: General   Post-op Pain Management:    Induction:   PONV Risk Score and Plan: Propofol infusion  Airway Management Planned:   Additional Equipment:   Intra-op Plan:   Post-operative Plan:   Informed Consent: I have reviewed the patients History and Physical, chart, labs and discussed the procedure including the risks, benefits and alternatives for the proposed anesthesia with the patient or authorized representative who has indicated his/her understanding and acceptance.     Dental Advisory Given  Plan Discussed with: CRNA  Anesthesia Plan Comments:         Anesthesia Quick Evaluation

## 2020-06-04 NOTE — H&P (Signed)
Primary Care Physician:  Iona Beard, MD Primary Gastroenterologist:  Dr. Abbey Chatters  Pre-Procedure History & Physical: HPI:  [name] is a 64 y.o. male is here for a surveillance colonoscopy due to history of polyps  Past Medical History:  Diagnosis Date  . Alcohol abuse   . Arthritis   . Asthma   . Carrier of viral hepatitis ([place])   . Chronic hepatitis C ([place])    successfully treated at [facility]  . Cocaine dependence ([place])   . Dysphagia   . Hepatitis   . Hypertension   . Personal history of colonic polyps     Past Surgical History:  Procedure Laterality Date  . BIOPSY  05/05/2016   Procedure: BIOPSY;  Surgeon: Danie Binder, MD;  Location: [facility];  Service: Endoscopy;;  transverse colon polyp  . COLONOSCOPY     SLF: Multiple polyps, simple adenoma. NEXT TCS 04/2019  . COLONOSCOPY WITH PROPOFOL N/A 05/05/2016   Procedure: COLONOSCOPY WITH PROPOFOL;  Surgeon: Danie Binder, MD;  Location: [facility];  Service: Endoscopy;  Laterality: N/A;  1000  . ESOPHAGOGASTRODUODENOSCOPY  2011   Dr. Oneida Alar: Probable esophageal web status post dilation, gastritis.  Biopsies benign, no H. pylori  . KNEE ARTHROPLASTY Left   . KNEE ARTHROSCOPY Right   . POLYPECTOMY  05/05/2016   Procedure: POLYPECTOMY;  Surgeon: Danie Binder, MD;  Location: [facility];  Service: Endoscopy;;  transverse colon, descending colon and sigmoid colon polyps  . TRIGGER FINGER RELEASE Left 2010    Prior to Admission medications   Medication Sig Start Date End Date Taking? Authorizing Provider  amLODipine (NORVASC) 5 MG tablet Take 5 mg by mouth daily.  12/17/11  Yes [provider]  aspirin EC 81 MG tablet Take 81 mg by mouth daily.   Yes [provider]  esomeprazole (NEXIUM) 40 MG capsule 1 po 30 mins prior to your first meal TO [place] Patient taking differently: Take 40 mg by mouth daily as needed (acid reflux).  01/08/20  Yes Mahala Menghini, PA-C   fenofibrate (TRICOR) 48 MG tablet Take 48 mg by mouth daily.  04/19/14  Yes [provider]  fluticasone (FLONASE) 50 MCG/ACT nasal spray Place 2 sprays into both nostrils daily as needed for allergies or rhinitis.   Yes [provider]  lisinopril-hydrochlorothiazide (PRINZIDE,ZESTORETIC) 20-12.5 MG tablet Take 2 tablets by mouth daily.  12/17/11  Yes [provider]  loratadine (CLARITIN) 10 MG tablet Take 10 mg by mouth daily.   Yes [provider]  polyethylene glycol-electrolytes (TRILYTE) 420 g solution Take 4,000 mLs by mouth as directed. 01/08/20  Yes Rourk, Cristopher Estimable, MD  ibuprofen (ADVIL) 800 MG tablet Take 800 mg by mouth every 8 (eight) hours as needed for moderate pain.    [provider]    Allergies as of 04/23/2020  . (No Known Allergies)    Family History  Problem Relation Age of Onset  . Colon cancer Neg Hx     Social History   Socioeconomic History  . Marital status: Married    Spouse name: Not on file  . Number of children: Not on file  . Years of education: Not on file  . Highest education level: Not on file  Occupational History  . Not on file  Tobacco Use  . Smoking status: Current Every Day Smoker    Packs/day: 1.00    Types: Cigarettes  . Smokeless tobacco: Never Used  Vaping Use  . Vaping Use: Never used  Substance and Sexual Activity  . Alcohol use: Yes    Alcohol/week: 0.0 standard drinks    Comment: once a week, one pint per sitting.  . Drug use: Not Currently    Types: Marijuana    Comment: Occasional marijuana  . Sexual activity: Not on file  Other Topics Concern  . Not on file  Social History Narrative  . Not on file   Social Determinants of Health   Financial Resource Strain:   . Difficulty of Paying Living Expenses: Not on file  Food Insecurity:   . Worried About Charity fundraiser in the Last Year: Not on file  . Ran Out of Food in the Last Year: Not on file  Transportation Needs:   .  Lack of Transportation (Medical): Not on file  . Lack of Transportation (Non-Medical): Not on file  Physical Activity:   . Days of Exercise per Week: Not on file  . Minutes of Exercise per Session: Not on file  Stress:   . Feeling of Stress : Not on file  Social Connections:   . Frequency of Communication with Friends and Family: Not on file  . Frequency of Social Gatherings with Friends and Family: Not on file  . Attends Religious Services: Not on file  . Active Member of Clubs or Organizations: Not on file  . Attends Archivist Meetings: Not on file  . Marital Status: Not on file  Intimate Partner Violence:   . Fear of Current or Ex-Partner: Not on file  . Emotionally Abused: Not on file  . Physically Abused: Not on file  . Sexually Abused: Not on file    Review of Systems: See HPI, otherwise negative ROS  Impression/Plan: [name] is here for a colonoscopy to be performed for surveillance due to history of polyps  Risks, benefits, limitations, imponderables and alternatives regarding colonoscopy have been reviewed with the patient. Questions have been answered. All parties agreeable.

## 2020-06-04 NOTE — Anesthesia Postprocedure Evaluation (Signed)
Anesthesia Post Note  Patient: [name]  Procedure(s) Performed: COLONOSCOPY WITH PROPOFOL (N/A ) POLYPECTOMY  Patient location during evaluation: PACU Anesthesia Type: General Level of consciousness: awake and alert Pain management: pain level controlled Vital Signs Assessment: post-procedure vital signs reviewed and stable Respiratory status: spontaneous breathing Cardiovascular status: stable Postop Assessment: no apparent nausea or vomiting Anesthetic complications: no   No complications documented.   Last Vitals:  Vitals:   06/04/20 1015 06/04/20 1030  BP: 125/69 140/68  Pulse: 81 81  Resp: 16 (!) 22  Temp:    SpO2: 95% 95%    Last Pain:  Vitals:   06/04/20 0930  PainSc: 0-No pain                 Everette Rank

## 2020-06-04 NOTE — Transfer of Care (Signed)
Immediate Anesthesia Transfer of Care Note  Patient: [name]  Procedure(s) Performed: COLONOSCOPY WITH PROPOFOL (N/A ) POLYPECTOMY  Patient Location: PACU  Anesthesia Type:MAC and General  Level of Consciousness: awake, alert , oriented and patient cooperative  Airway & Oxygen Therapy: Patient Spontanous Breathing  Post-op Assessment: Report given to RN and Post -op Vital signs reviewed and stable  Post vital signs: Reviewed and stable  Last Vitals:  Vitals Value Taken Time  BP    Temp    Pulse 100 06/04/20 1111  Resp 21 06/04/20 1111  SpO2 98 % 06/04/20 1111  Vitals shown include unvalidated device data.  Last Pain:  Vitals:   06/04/20 0930  PainSc: 0-No pain         Complications: No complications documented.

## 2020-06-06 ENCOUNTER — Encounter (HOSPITAL_COMMUNITY): Payer: Self-pay | Admitting: Internal Medicine

## 2020-06-06 LAB — SURGICAL PATHOLOGY

## 2020-06-11 DIAGNOSIS — M159 Polyosteoarthritis, unspecified: Secondary | ICD-10-CM | POA: Diagnosis not present

## 2020-06-11 DIAGNOSIS — Z720 Tobacco use: Secondary | ICD-10-CM | POA: Diagnosis not present

## 2020-06-11 DIAGNOSIS — I10 Essential (primary) hypertension: Secondary | ICD-10-CM | POA: Diagnosis not present

## 2020-06-11 DIAGNOSIS — E785 Hyperlipidemia, unspecified: Secondary | ICD-10-CM | POA: Diagnosis not present

## 2020-06-11 DIAGNOSIS — K219 Gastro-esophageal reflux disease without esophagitis: Secondary | ICD-10-CM | POA: Diagnosis not present

## 2020-08-10 DIAGNOSIS — M4807 Spinal stenosis, lumbosacral region: Secondary | ICD-10-CM | POA: Diagnosis not present

## 2020-08-10 DIAGNOSIS — I10 Essential (primary) hypertension: Secondary | ICD-10-CM | POA: Diagnosis not present

## 2020-08-10 DIAGNOSIS — Z720 Tobacco use: Secondary | ICD-10-CM | POA: Diagnosis not present

## 2020-08-10 DIAGNOSIS — E785 Hyperlipidemia, unspecified: Secondary | ICD-10-CM | POA: Diagnosis not present

## 2020-08-19 DIAGNOSIS — Z720 Tobacco use: Secondary | ICD-10-CM | POA: Diagnosis not present

## 2020-08-19 DIAGNOSIS — E785 Hyperlipidemia, unspecified: Secondary | ICD-10-CM | POA: Diagnosis not present

## 2020-08-19 DIAGNOSIS — E789 Disorder of lipoprotein metabolism, unspecified: Secondary | ICD-10-CM | POA: Diagnosis not present

## 2020-08-19 DIAGNOSIS — J439 Emphysema, unspecified: Secondary | ICD-10-CM | POA: Diagnosis not present

## 2020-08-19 DIAGNOSIS — R21 Rash and other nonspecific skin eruption: Secondary | ICD-10-CM | POA: Diagnosis not present

## 2020-08-19 DIAGNOSIS — Z0000 Encounter for general adult medical examination without abnormal findings: Secondary | ICD-10-CM | POA: Diagnosis not present

## 2020-08-19 DIAGNOSIS — I10 Essential (primary) hypertension: Secondary | ICD-10-CM | POA: Diagnosis not present

## 2020-09-10 DIAGNOSIS — Z720 Tobacco use: Secondary | ICD-10-CM | POA: Diagnosis not present

## 2020-09-10 DIAGNOSIS — I10 Essential (primary) hypertension: Secondary | ICD-10-CM | POA: Diagnosis not present

## 2020-09-10 DIAGNOSIS — E785 Hyperlipidemia, unspecified: Secondary | ICD-10-CM | POA: Diagnosis not present

## 2020-09-10 DIAGNOSIS — M4807 Spinal stenosis, lumbosacral region: Secondary | ICD-10-CM | POA: Diagnosis not present

## 2020-10-11 DIAGNOSIS — E785 Hyperlipidemia, unspecified: Secondary | ICD-10-CM | POA: Diagnosis not present

## 2020-10-11 DIAGNOSIS — M4807 Spinal stenosis, lumbosacral region: Secondary | ICD-10-CM | POA: Diagnosis not present

## 2020-10-11 DIAGNOSIS — Z720 Tobacco use: Secondary | ICD-10-CM | POA: Diagnosis not present

## 2020-10-11 DIAGNOSIS — I10 Essential (primary) hypertension: Secondary | ICD-10-CM | POA: Diagnosis not present

## 2020-11-09 DIAGNOSIS — Z720 Tobacco use: Secondary | ICD-10-CM | POA: Diagnosis not present

## 2020-11-09 DIAGNOSIS — I10 Essential (primary) hypertension: Secondary | ICD-10-CM | POA: Diagnosis not present

## 2020-11-09 DIAGNOSIS — M4807 Spinal stenosis, lumbosacral region: Secondary | ICD-10-CM | POA: Diagnosis not present

## 2020-11-09 DIAGNOSIS — E785 Hyperlipidemia, unspecified: Secondary | ICD-10-CM | POA: Diagnosis not present

## 2020-12-03 ENCOUNTER — Encounter: Payer: Self-pay | Admitting: Internal Medicine

## 2020-12-09 DIAGNOSIS — I10 Essential (primary) hypertension: Secondary | ICD-10-CM | POA: Diagnosis not present

## 2020-12-09 DIAGNOSIS — E785 Hyperlipidemia, unspecified: Secondary | ICD-10-CM | POA: Diagnosis not present

## 2020-12-09 DIAGNOSIS — M4807 Spinal stenosis, lumbosacral region: Secondary | ICD-10-CM | POA: Diagnosis not present

## 2020-12-09 DIAGNOSIS — Z720 Tobacco use: Secondary | ICD-10-CM | POA: Diagnosis not present

## 2020-12-17 DIAGNOSIS — I10 Essential (primary) hypertension: Secondary | ICD-10-CM | POA: Diagnosis not present

## 2020-12-17 DIAGNOSIS — E785 Hyperlipidemia, unspecified: Secondary | ICD-10-CM | POA: Diagnosis not present

## 2020-12-17 DIAGNOSIS — Z720 Tobacco use: Secondary | ICD-10-CM | POA: Diagnosis not present

## 2020-12-17 DIAGNOSIS — R21 Rash and other nonspecific skin eruption: Secondary | ICD-10-CM | POA: Diagnosis not present

## 2020-12-17 DIAGNOSIS — J439 Emphysema, unspecified: Secondary | ICD-10-CM | POA: Diagnosis not present

## 2020-12-30 ENCOUNTER — Other Ambulatory Visit: Payer: Self-pay | Admitting: Family Medicine

## 2020-12-30 DIAGNOSIS — Z720 Tobacco use: Secondary | ICD-10-CM

## 2021-01-09 DIAGNOSIS — E785 Hyperlipidemia, unspecified: Secondary | ICD-10-CM | POA: Diagnosis not present

## 2021-01-09 DIAGNOSIS — M4807 Spinal stenosis, lumbosacral region: Secondary | ICD-10-CM | POA: Diagnosis not present

## 2021-01-09 DIAGNOSIS — I10 Essential (primary) hypertension: Secondary | ICD-10-CM | POA: Diagnosis not present

## 2021-01-23 ENCOUNTER — Ambulatory Visit (HOSPITAL_COMMUNITY): Payer: Medicare Other

## 2021-01-23 ENCOUNTER — Encounter (HOSPITAL_COMMUNITY): Payer: Self-pay

## 2021-02-25 ENCOUNTER — Ambulatory Visit (HOSPITAL_COMMUNITY)
Admission: RE | Admit: 2021-02-25 | Discharge: 2021-02-25 | Disposition: A | Discharge status: Home / Self Care | Payer: Medicare Other | Source: Ambulatory Visit | Attending: Family Medicine | Admitting: Family Medicine

## 2021-02-25 DIAGNOSIS — Z87891 Personal history of nicotine dependence: Secondary | ICD-10-CM | POA: Diagnosis not present

## 2021-02-25 DIAGNOSIS — I700 Atherosclerosis of aorta: Secondary | ICD-10-CM | POA: Insufficient documentation

## 2021-02-25 DIAGNOSIS — Z122 Encounter for screening for malignant neoplasm of respiratory organs: Secondary | ICD-10-CM | POA: Diagnosis not present

## 2021-02-25 DIAGNOSIS — F17210 Nicotine dependence, cigarettes, uncomplicated: Secondary | ICD-10-CM | POA: Diagnosis not present

## 2021-02-25 DIAGNOSIS — Z720 Tobacco use: Secondary | ICD-10-CM

## 2021-02-25 DIAGNOSIS — J439 Emphysema, unspecified: Secondary | ICD-10-CM | POA: Insufficient documentation

## 2021-03-11 DIAGNOSIS — I10 Essential (primary) hypertension: Secondary | ICD-10-CM | POA: Diagnosis not present

## 2021-03-11 DIAGNOSIS — Z720 Tobacco use: Secondary | ICD-10-CM | POA: Diagnosis not present

## 2021-03-11 DIAGNOSIS — E785 Hyperlipidemia, unspecified: Secondary | ICD-10-CM | POA: Diagnosis not present

## 2021-03-11 DIAGNOSIS — M4807 Spinal stenosis, lumbosacral region: Secondary | ICD-10-CM | POA: Diagnosis not present

## 2021-03-17 DIAGNOSIS — R21 Rash and other nonspecific skin eruption: Secondary | ICD-10-CM | POA: Diagnosis not present

## 2021-03-17 DIAGNOSIS — E785 Hyperlipidemia, unspecified: Secondary | ICD-10-CM | POA: Diagnosis not present

## 2021-03-17 DIAGNOSIS — I10 Essential (primary) hypertension: Secondary | ICD-10-CM | POA: Diagnosis not present

## 2021-03-17 DIAGNOSIS — J439 Emphysema, unspecified: Secondary | ICD-10-CM | POA: Diagnosis not present

## 2021-03-17 DIAGNOSIS — Z720 Tobacco use: Secondary | ICD-10-CM | POA: Diagnosis not present

## 2021-04-10 DIAGNOSIS — M4807 Spinal stenosis, lumbosacral region: Secondary | ICD-10-CM | POA: Diagnosis not present

## 2021-04-10 DIAGNOSIS — I10 Essential (primary) hypertension: Secondary | ICD-10-CM | POA: Diagnosis not present

## 2021-04-10 DIAGNOSIS — Z720 Tobacco use: Secondary | ICD-10-CM | POA: Diagnosis not present

## 2021-04-10 DIAGNOSIS — E785 Hyperlipidemia, unspecified: Secondary | ICD-10-CM | POA: Diagnosis not present

## 2021-05-11 DIAGNOSIS — M4807 Spinal stenosis, lumbosacral region: Secondary | ICD-10-CM | POA: Diagnosis not present

## 2021-05-11 DIAGNOSIS — E785 Hyperlipidemia, unspecified: Secondary | ICD-10-CM | POA: Diagnosis not present

## 2021-05-11 DIAGNOSIS — I10 Essential (primary) hypertension: Secondary | ICD-10-CM | POA: Diagnosis not present

## 2021-05-11 DIAGNOSIS — Z720 Tobacco use: Secondary | ICD-10-CM | POA: Diagnosis not present

## 2021-06-11 DIAGNOSIS — Z720 Tobacco use: Secondary | ICD-10-CM | POA: Diagnosis not present

## 2021-06-11 DIAGNOSIS — E785 Hyperlipidemia, unspecified: Secondary | ICD-10-CM | POA: Diagnosis not present

## 2021-06-11 DIAGNOSIS — M4807 Spinal stenosis, lumbosacral region: Secondary | ICD-10-CM | POA: Diagnosis not present

## 2021-06-11 DIAGNOSIS — I10 Essential (primary) hypertension: Secondary | ICD-10-CM | POA: Diagnosis not present

## 2021-06-24 ENCOUNTER — Ambulatory Visit: Payer: Medicare Other | Admitting: Dermatology

## 2021-06-24 ENCOUNTER — Encounter (INDEPENDENT_AMBULATORY_CARE_PROVIDER_SITE_OTHER): Payer: Self-pay

## 2021-06-24 ENCOUNTER — Other Ambulatory Visit: Payer: Self-pay

## 2021-06-24 ENCOUNTER — Encounter: Payer: Self-pay | Admitting: Dermatology

## 2021-06-24 DIAGNOSIS — D485 Neoplasm of uncertain behavior of skin: Secondary | ICD-10-CM

## 2021-06-24 DIAGNOSIS — L409 Psoriasis, unspecified: Secondary | ICD-10-CM

## 2021-06-24 DIAGNOSIS — B078 Other viral warts: Secondary | ICD-10-CM | POA: Diagnosis not present

## 2021-06-24 MED ORDER — CLOBETASOL PROPIONATE 0.05 % EX OINT: TOPICAL_OINTMENT | CUTANEOUS | 1 refills | Status: AC

## 2021-06-24 NOTE — Patient Instructions (Signed)

## 2021-07-05 ENCOUNTER — Encounter: Payer: Self-pay | Admitting: Dermatology

## 2021-07-05 NOTE — Progress Notes (Signed)
   Follow-Up Visit   Subjective  [name] is a 65 y.o. male who presents for the following: Skin Problem (New rough spots & scale on left elbow and  both arms tx- cocoa butter- help some).  Rough spots on arms, no benefit from OTC products. Location:  Duration:  Quality:  Associated Signs/Symptoms: Modifying Factors:  Severity:  Timing: Context:   Objective  Well appearing patient in no apparent distress; mood and affect are within normal limits. Left Arm, Right Arm Heaped up hyperkeratotic plaques, only modest inflammation. This may represent prurigo nodularis (or even verrucae) rather than true psoriasis.       Right Forearm - Anterior 1.6cm verrucous gray brown heaped up crust       A focused examination was performed including head, neck, back, nails, extremities.. Relevant physical exam findings are noted in the Assessment and Plan.   Assessment & Plan    Psoriasis Left Arm; Right Arm  Will try topical clobetasol, ideally covered with either a moist wrap or Saran-type occlusion. Follow up 1.2 months. Discussed the need to avoid picking to prevent possible Koebnerization.  Related Medications clobetasol ointment (TEMOVATE) 0.05 % Apply to lesions on arms and hands nightly  Neoplasm of uncertain behavior of skin Right Forearm - Anterior  Skin / nail biopsy Type of biopsy: tangential   Informed consent: discussed and consent obtained   Timeout: patient name, date of birth, surgical site, and procedure verified   Anesthesia: the lesion was anesthetized in a standard fashion   Anesthetic:  1% lidocaine w/ epinephrine 1-100,000 local infiltration Instrument used: flexible razor blade   Hemostasis achieved with: aluminum chloride and electrodesiccation   Outcome: patient tolerated procedure well   Post-procedure details: wound care instructions given    Specimen 1 - Surgical pathology Differential Diagnosis: wart  Check Margins:  No      I, Lavonna Monarch, MD, have reviewed all documentation for this visit.  The documentation on 07/05/21 for the exam, diagnosis, procedures, and orders are all accurate and complete.

## 2021-07-11 DIAGNOSIS — Z720 Tobacco use: Secondary | ICD-10-CM | POA: Diagnosis not present

## 2021-07-11 DIAGNOSIS — M4807 Spinal stenosis, lumbosacral region: Secondary | ICD-10-CM | POA: Diagnosis not present

## 2021-07-11 DIAGNOSIS — I10 Essential (primary) hypertension: Secondary | ICD-10-CM | POA: Diagnosis not present

## 2021-07-11 DIAGNOSIS — E785 Hyperlipidemia, unspecified: Secondary | ICD-10-CM | POA: Diagnosis not present

## 2021-07-21 DIAGNOSIS — R7309 Other abnormal glucose: Secondary | ICD-10-CM | POA: Diagnosis not present

## 2021-07-21 DIAGNOSIS — I10 Essential (primary) hypertension: Secondary | ICD-10-CM | POA: Diagnosis not present

## 2021-07-21 DIAGNOSIS — J0100 Acute maxillary sinusitis, unspecified: Secondary | ICD-10-CM | POA: Diagnosis not present

## 2021-07-21 DIAGNOSIS — E785 Hyperlipidemia, unspecified: Secondary | ICD-10-CM | POA: Diagnosis not present

## 2021-08-11 DIAGNOSIS — Z720 Tobacco use: Secondary | ICD-10-CM | POA: Diagnosis not present

## 2021-08-11 DIAGNOSIS — I10 Essential (primary) hypertension: Secondary | ICD-10-CM | POA: Diagnosis not present

## 2021-08-11 DIAGNOSIS — E785 Hyperlipidemia, unspecified: Secondary | ICD-10-CM | POA: Diagnosis not present

## 2021-08-11 DIAGNOSIS — M4807 Spinal stenosis, lumbosacral region: Secondary | ICD-10-CM | POA: Diagnosis not present

## 2021-09-10 DIAGNOSIS — I10 Essential (primary) hypertension: Secondary | ICD-10-CM | POA: Diagnosis not present

## 2021-09-10 DIAGNOSIS — M4807 Spinal stenosis, lumbosacral region: Secondary | ICD-10-CM | POA: Diagnosis not present

## 2021-09-10 DIAGNOSIS — E785 Hyperlipidemia, unspecified: Secondary | ICD-10-CM | POA: Diagnosis not present

## 2021-10-10 DIAGNOSIS — I10 Essential (primary) hypertension: Secondary | ICD-10-CM | POA: Diagnosis not present

## 2021-10-10 DIAGNOSIS — Z720 Tobacco use: Secondary | ICD-10-CM | POA: Diagnosis not present

## 2021-10-10 DIAGNOSIS — E785 Hyperlipidemia, unspecified: Secondary | ICD-10-CM | POA: Diagnosis not present

## 2021-10-10 DIAGNOSIS — M4807 Spinal stenosis, lumbosacral region: Secondary | ICD-10-CM | POA: Diagnosis not present

## 2021-10-21 DIAGNOSIS — K429 Umbilical hernia without obstruction or gangrene: Secondary | ICD-10-CM | POA: Diagnosis not present

## 2021-10-21 DIAGNOSIS — E785 Hyperlipidemia, unspecified: Secondary | ICD-10-CM | POA: Diagnosis not present

## 2021-10-21 DIAGNOSIS — I10 Essential (primary) hypertension: Secondary | ICD-10-CM | POA: Diagnosis not present

## 2021-10-21 DIAGNOSIS — J439 Emphysema, unspecified: Secondary | ICD-10-CM | POA: Diagnosis not present

## 2021-10-21 DIAGNOSIS — M109 Gout, unspecified: Secondary | ICD-10-CM | POA: Diagnosis not present

## 2021-11-09 DIAGNOSIS — E785 Hyperlipidemia, unspecified: Secondary | ICD-10-CM | POA: Diagnosis not present

## 2021-11-09 DIAGNOSIS — Z720 Tobacco use: Secondary | ICD-10-CM | POA: Diagnosis not present

## 2021-11-09 DIAGNOSIS — M4807 Spinal stenosis, lumbosacral region: Secondary | ICD-10-CM | POA: Diagnosis not present

## 2021-11-09 DIAGNOSIS — I10 Essential (primary) hypertension: Secondary | ICD-10-CM | POA: Diagnosis not present

## 2021-12-09 DIAGNOSIS — E785 Hyperlipidemia, unspecified: Secondary | ICD-10-CM | POA: Diagnosis not present

## 2021-12-09 DIAGNOSIS — M4807 Spinal stenosis, lumbosacral region: Secondary | ICD-10-CM | POA: Diagnosis not present

## 2021-12-09 DIAGNOSIS — I10 Essential (primary) hypertension: Secondary | ICD-10-CM | POA: Diagnosis not present

## 2021-12-09 DIAGNOSIS — Z720 Tobacco use: Secondary | ICD-10-CM | POA: Diagnosis not present

## 2022-01-09 DIAGNOSIS — I10 Essential (primary) hypertension: Secondary | ICD-10-CM | POA: Diagnosis not present

## 2022-01-09 DIAGNOSIS — M4807 Spinal stenosis, lumbosacral region: Secondary | ICD-10-CM | POA: Diagnosis not present

## 2022-01-09 DIAGNOSIS — E785 Hyperlipidemia, unspecified: Secondary | ICD-10-CM | POA: Diagnosis not present

## 2022-01-27 ENCOUNTER — Other Ambulatory Visit (HOSPITAL_COMMUNITY): Payer: Self-pay | Admitting: Family Medicine

## 2022-01-27 ENCOUNTER — Other Ambulatory Visit: Payer: Self-pay | Admitting: Family Medicine

## 2022-01-27 DIAGNOSIS — F17200 Nicotine dependence, unspecified, uncomplicated: Secondary | ICD-10-CM

## 2022-02-17 DIAGNOSIS — I10 Essential (primary) hypertension: Secondary | ICD-10-CM | POA: Diagnosis not present

## 2022-02-17 DIAGNOSIS — J439 Emphysema, unspecified: Secondary | ICD-10-CM | POA: Diagnosis not present

## 2022-02-17 DIAGNOSIS — E785 Hyperlipidemia, unspecified: Secondary | ICD-10-CM | POA: Diagnosis not present

## 2022-02-17 DIAGNOSIS — M109 Gout, unspecified: Secondary | ICD-10-CM | POA: Diagnosis not present

## 2022-03-24 ENCOUNTER — Ambulatory Visit (HOSPITAL_COMMUNITY)
Admission: RE | Admit: 2022-03-24 | Discharge: 2022-03-24 | Disposition: A | Discharge status: Home / Self Care | Payer: Medicare Other | Source: Ambulatory Visit | Attending: Family Medicine | Admitting: Family Medicine

## 2022-03-24 DIAGNOSIS — J432 Centrilobular emphysema: Secondary | ICD-10-CM | POA: Diagnosis not present

## 2022-03-24 DIAGNOSIS — K760 Fatty (change of) liver, not elsewhere classified: Secondary | ICD-10-CM | POA: Diagnosis not present

## 2022-03-24 DIAGNOSIS — F17210 Nicotine dependence, cigarettes, uncomplicated: Secondary | ICD-10-CM | POA: Diagnosis not present

## 2022-03-24 DIAGNOSIS — I700 Atherosclerosis of aorta: Secondary | ICD-10-CM | POA: Diagnosis not present

## 2022-03-24 DIAGNOSIS — Z122 Encounter for screening for malignant neoplasm of respiratory organs: Secondary | ICD-10-CM | POA: Diagnosis not present

## 2022-03-24 DIAGNOSIS — F17200 Nicotine dependence, unspecified, uncomplicated: Secondary | ICD-10-CM | POA: Insufficient documentation

## 2022-04-10 DIAGNOSIS — I10 Essential (primary) hypertension: Secondary | ICD-10-CM | POA: Diagnosis not present

## 2022-04-10 DIAGNOSIS — E785 Hyperlipidemia, unspecified: Secondary | ICD-10-CM | POA: Diagnosis not present

## 2022-04-22 ENCOUNTER — Ambulatory Visit: Payer: Medicare Other | Admitting: Dermatology

## 2022-04-22 DIAGNOSIS — L281 Prurigo nodularis: Secondary | ICD-10-CM | POA: Diagnosis not present

## 2022-04-22 DIAGNOSIS — L720 Epidermal cyst: Secondary | ICD-10-CM

## 2022-04-22 DIAGNOSIS — D485 Neoplasm of uncertain behavior of skin: Secondary | ICD-10-CM

## 2022-04-22 NOTE — Patient Instructions (Signed)

## 2022-05-06 ENCOUNTER — Other Ambulatory Visit: Payer: Self-pay

## 2022-05-06 ENCOUNTER — Emergency Department (HOSPITAL_COMMUNITY): Payer: Medicare Other

## 2022-05-06 ENCOUNTER — Emergency Department (HOSPITAL_COMMUNITY)
Admission: EM | Admit: 2022-05-06 | Discharge: 2022-05-07 | Disposition: A | Discharge status: Home / Self Care | Payer: Medicare Other | Attending: Emergency Medicine | Admitting: Emergency Medicine

## 2022-05-06 ENCOUNTER — Encounter (HOSPITAL_COMMUNITY): Payer: Self-pay | Admitting: *Deleted

## 2022-05-06 DIAGNOSIS — M79644 Pain in right finger(s): Secondary | ICD-10-CM | POA: Insufficient documentation

## 2022-05-06 DIAGNOSIS — T1490XA Injury, unspecified, initial encounter: Secondary | ICD-10-CM | POA: Diagnosis not present

## 2022-05-06 DIAGNOSIS — S20212A Contusion of left front wall of thorax, initial encounter: Secondary | ICD-10-CM | POA: Diagnosis not present

## 2022-05-06 DIAGNOSIS — S299XXA Unspecified injury of thorax, initial encounter: Secondary | ICD-10-CM | POA: Diagnosis present

## 2022-05-06 DIAGNOSIS — S0990XA Unspecified injury of head, initial encounter: Secondary | ICD-10-CM | POA: Diagnosis not present

## 2022-05-06 DIAGNOSIS — Z7982 Long term (current) use of aspirin: Secondary | ICD-10-CM | POA: Diagnosis not present

## 2022-05-06 DIAGNOSIS — J439 Emphysema, unspecified: Secondary | ICD-10-CM | POA: Diagnosis not present

## 2022-05-06 DIAGNOSIS — G9389 Other specified disorders of brain: Secondary | ICD-10-CM | POA: Diagnosis not present

## 2022-05-06 DIAGNOSIS — W19XXXA Unspecified fall, initial encounter: Secondary | ICD-10-CM | POA: Insufficient documentation

## 2022-05-06 DIAGNOSIS — R0789 Other chest pain: Secondary | ICD-10-CM | POA: Diagnosis not present

## 2022-05-06 DIAGNOSIS — S90111A Contusion of right great toe without damage to nail, initial encounter: Secondary | ICD-10-CM | POA: Insufficient documentation

## 2022-05-06 DIAGNOSIS — M7989 Other specified soft tissue disorders: Secondary | ICD-10-CM | POA: Diagnosis not present

## 2022-05-06 DIAGNOSIS — Z043 Encounter for examination and observation following other accident: Secondary | ICD-10-CM | POA: Diagnosis not present

## 2022-05-06 DIAGNOSIS — S99921A Unspecified injury of right foot, initial encounter: Secondary | ICD-10-CM | POA: Diagnosis not present

## 2022-05-06 DIAGNOSIS — M79671 Pain in right foot: Secondary | ICD-10-CM | POA: Diagnosis not present

## 2022-05-06 LAB — BASIC METABOLIC PANEL
Anion gap: 9 (ref 5–15)
BUN: 15 mg/dL (ref 8–23)
CO2: 26 mmol/L (ref 22–32)
Calcium: 8.5 mg/dL — ABNORMAL LOW (ref 8.9–10.3)
Chloride: 105 mmol/L (ref 98–111)
Creatinine, Ser: 0.9 mg/dL (ref 0.61–1.24)
GFR, Estimated: >60 mL/min (ref >60)
Glucose, Bld: 92 mg/dL (ref 70–99)
Potassium: 3.6 mmol/L (ref 3.5–5.1)
Sodium: 140 mmol/L (ref 135–145)

## 2022-05-06 LAB — CBC
HCT: 44.8 % (ref 39.0–52.0)
Hemoglobin: 15.6 g/dL (ref 13.0–17.0)
MCH: 30.2 pg (ref 26.0–34.0)
MCHC: 34.8 g/dL (ref 30.0–36.0)
MCV: 86.7 fL (ref 80.0–100.0)
Platelets: 360 10*3/uL (ref 150–400)
RBC: 5.17 MIL/uL (ref 4.22–5.81)
RDW: 13 % (ref 11.5–15.5)
WBC: 12 10*3/uL — ABNORMAL HIGH (ref 4.0–10.5)
nRBC: 0 % (ref 0.0–0.2)

## 2022-05-06 LAB — TROPONIN I (HIGH SENSITIVITY): Troponin I (High Sensitivity): 6 ng/L (ref <18)

## 2022-05-06 NOTE — ED Triage Notes (Signed)
Pt in c/o falling while drinking with friends yesterday, denies LOC, unable to state what he landed on, does not take blood thinners, A&O x4

## 2022-05-06 NOTE — ED Notes (Signed)
Pt c/o L knee pain and R big toe pain

## 2022-05-07 LAB — TROPONIN I (HIGH SENSITIVITY): Troponin I (High Sensitivity): 5 ng/L (ref <18)

## 2022-05-11 DIAGNOSIS — I10 Essential (primary) hypertension: Secondary | ICD-10-CM | POA: Diagnosis not present

## 2022-05-11 DIAGNOSIS — E785 Hyperlipidemia, unspecified: Secondary | ICD-10-CM | POA: Diagnosis not present

## 2022-05-11 DIAGNOSIS — M4807 Spinal stenosis, lumbosacral region: Secondary | ICD-10-CM | POA: Diagnosis not present

## 2022-05-12 NOTE — ED Provider Notes (Signed)
[facility] EMERGENCY DEPARTMENT Provider Note   CSN: [id number] Arrival date & time: 05/06/22  1714     History  Chief Complaint  Patient presents with   Chest Pain    [name] is a 66 y.o. male.  Patient with fall yesterday while out drinking with friends.  Said he had a little too much to drink.  Does not really recall what he landed on.  Patient not taking any blood thinners.  Patient complaining of right middle finger pain right big toe pain and left anterior lateral chest pain.  Patient denies loss of consciousness but does not have too much recollection of what occurred.  The chest pain hurts to press on it hurts to take a deep breath.  Hurts to move.  Patient with a complaint of right foot pain can of the big toe seems to be swollen and black and blue.  Also right middle finger which has some swelling.  And left anterior ribs.  The fall occurred on Tuesday at 2000.       Home Medications Prior to Admission medications   Medication Sig Start Date End Date Taking? Authorizing Provider  amLODipine (NORVASC) 5 MG tablet Take 5 mg by mouth daily.  12/17/11   [provider]  aspirin EC 81 MG tablet Take 81 mg by mouth daily.    [provider]  clobetasol ointment (TEMOVATE) 0.05 % Apply to lesions on arms and hands nightly 06/24/21   Lavonna Monarch, MD  esomeprazole (NEXIUM) 40 MG capsule 1 po 30 mins prior to your first meal TO [employer] Patient taking differently: Take 40 mg by mouth daily as needed (acid reflux). 01/08/20   Mahala Menghini, PA-C  fenofibrate (TRICOR) 48 MG tablet Take 48 mg by mouth daily.  04/19/14   [provider]  fluticasone (FLONASE) 50 MCG/ACT nasal spray Place 2 sprays into both nostrils daily as needed for allergies or rhinitis.    [provider]  ibuprofen (ADVIL) 800 MG tablet Take 800 mg by mouth every 8 (eight) hours as needed for moderate pain.    [provider]   lisinopril-hydrochlorothiazide (PRINZIDE,ZESTORETIC) 20-12.5 MG tablet Take 2 tablets by mouth daily.  12/17/11   [provider]  loratadine (CLARITIN) 10 MG tablet Take 10 mg by mouth daily.    [provider]  peginterferon alfa-2a (PEGASYS) 180 MCG/ML injection INJECT 180MCG/ML UNDER THE SKIN ONCE A WEEK 07/30/11   [provider]  polyethylene glycol-electrolytes (TRILYTE) 420 g solution Take 4,000 mLs by mouth as directed. 01/08/20   Rourk, Cristopher Estimable, MD      Allergies    Patient has no known allergies.    Review of Systems   Review of Systems  Constitutional:  Negative for chills and fever.  HENT:  Negative for ear pain and sore throat.   Eyes:  Negative for pain and visual disturbance.  Respiratory:  Negative for cough and shortness of breath.   Cardiovascular:  Positive for chest pain. Negative for palpitations and leg swelling.  Gastrointestinal:  Negative for abdominal pain and vomiting.  Genitourinary:  Negative for dysuria and hematuria.  Musculoskeletal:  Positive for joint swelling. Negative for arthralgias, back pain and neck pain.  Skin:  Negative for color change and rash.  Neurological:  Negative for seizures and syncope.  All other systems reviewed and are negative.   Physical Exam Updated Vital Signs BP (!) 162/84   Pulse 79   Temp  98.2 F (36.8 C) (Oral)   Resp 20   Ht 1.753 m ('5\' 9"'$ )   SpO2 99%   BMI 33.67 kg/m  Physical Exam Vitals and nursing note reviewed.  Constitutional:      General: He is not in acute distress.    Appearance: He is well-developed.  HENT:     Head: Normocephalic and atraumatic.  Eyes:     Conjunctiva/sclera: Conjunctivae normal.  Cardiovascular:     Rate and Rhythm: Normal rate and regular rhythm.     Heart sounds: No murmur heard. Pulmonary:     Effort: Pulmonary effort is normal. No tachypnea or respiratory distress.     Breath sounds: Normal breath sounds. No decreased breath sounds, wheezing,  rhonchi or rales.  Chest:     Chest wall: Tenderness present. No crepitus.     Comments: Tenderness palpation left anterior chest and lateral inferiorly. Abdominal:     Palpations: Abdomen is soft.     Tenderness: There is no abdominal tenderness. There is no guarding.  Musculoskeletal:        General: No swelling.     Cervical back: Neck supple.     Right lower leg: Tenderness present. No edema.     Left lower leg: No edema.     Comments: Right middle finger with some swelling some discomfort with range of motion.  Neurovascularly intact.  Radial pulse 2+ good cap refill.  Right great toe has some swelling and ecchymosis.  Good cap refill sensation intact.  Right knee with good range of motion no evidence of effusion.  No tenderness to palpation.  Patella is not dislocated.  Skin:    General: Skin is warm and dry.     Capillary Refill: Capillary refill takes less than 2 seconds.  Neurological:     Mental Status: He is alert.  Psychiatric:        Mood and Affect: Mood normal.     ED Results / Procedures / Treatments   Labs (all labs ordered are listed, but only abnormal results are displayed) Labs Reviewed  BASIC METABOLIC PANEL - Abnormal; Notable for the following components:      Result Value   Calcium 8.5 (*)    All other components within normal limits  CBC - Abnormal; Notable for the following components:   WBC 12.0 (*)    All other components within normal limits  TROPONIN I (HIGH SENSITIVITY)  TROPONIN I (HIGH SENSITIVITY)    EKG EKG Interpretation  Date/Time:  Wednesday May 06 2022 23:19:23 EDT Ventricular Rate:  87 PR Interval:  158 QRS Duration: 105 QT Interval:  370 QTC Calculation: 446 R Axis:   83 Text Interpretation: Sinus rhythm Borderline right axis deviation Confirmed by Fredia Sorrow [phone]) on 05/06/2022 11:25:22 PM  Radiology No results found.  Procedures Procedures    Medications Ordered in ED Medications - No data to display  ED  Course/ Medical Decision Making/ A&P                           Medical Decision Making Amount and/or Complexity of Data Reviewed Labs: ordered. Radiology: ordered.   Patient status post fall.  CT head negative patient had regular checks x-ray before I saw him.  Which showed some emphysema but nothing acute.  EKG without any acute findings.  I did CT chest with left ribs which showed no displaced rib fractures no pneumothorax.  Clinically patient has rib contusion because  he is tender to palpate in that area.  X-ray of the middle finger was negative for any bony abnormalities.  X-ray of the foot soft tissue swelling without evidence of fractures some osteopenia and degenerative changes small ossicle at the medial aspect of the great toe IP joint could be due to remote trauma.  Accessory sesamoid bone or small intra-articular loose body.  But not felt to be acute.  Labs patient had a troponin that was normal at 5 basic metabolic panel normal other than calcium down a little bit at 8.5.  CBC white count was 12,000 hemoglobin 15.6.  Platelets 360.  Patient does not need delta troponin this seems to be chest wall pain rib contusion.  Patient stable for discharge home and over-the-counter pain medication and follow-up with his doctor.  Patient encouraged to take deep breaths return for any development of fever or difficulty breathing which could be development of pneumonia.   Final Clinical Impression(s) / ED Diagnoses Final diagnoses:  Fall, initial encounter  Contusion of rib on left side, initial encounter    Rx / DC Orders ED Discharge Orders     None         Fredia Sorrow, MD 05/12/22 [phone]

## 2022-05-12 NOTE — Discharge Instructions (Addendum)
Continue to take the pain medicine that you have been taking.  Work-up only showed evidence of possible rib contusion no evidence of any fractures or any significant injuries from the fall.  Return for any fevers very important to take deep breaths frequently.  Make an appointment follow-up with your doctor.

## 2022-05-15 ENCOUNTER — Encounter: Payer: Self-pay | Admitting: Dermatology

## 2022-05-15 NOTE — Progress Notes (Signed)
   Follow-Up Visit   Subjective  [name] is a 66 y.o. male who presents for the following: Skin Problem (Patient has a growth on his chin he wants removed. ).  Lesion right chin jawline has grown Location:  Duration:  Quality:  Associated Signs/Symptoms: Modifying Factors:  Severity:  Timing: Context:   Objective  Well appearing patient in no apparent distress; mood and affect are within normal limits. Right Anterior Mandible [place] like 1 cm nodule.  Diagnosis uncertain but when shave biopsy done deeper keratin noted suggestive of traumatized epidermoid cyst.       A focused examination was performed including head and neck. Relevant physical exam findings are noted in the Assessment and Plan.   Assessment & Plan    Neoplasm of uncertain behavior of skin Right Anterior Mandible  Skin / nail biopsy Type of biopsy: tangential   Informed consent: discussed and consent obtained   Timeout: patient name, date of birth, surgical site, and procedure verified   Anesthesia: the lesion was anesthetized in a standard fashion   Anesthetic:  1% lidocaine w/ epinephrine 1-100,000 local infiltration Instrument used: flexible razor blade   Hemostasis achieved with: ferric subsulfate   Outcome: patient tolerated procedure well   Post-procedure details: wound care instructions given    Specimen 1 - Surgical pathology Differential Diagnosis: wart  Check Margins: No      I, Lavonna Monarch, MD, have reviewed all documentation for this visit.  The documentation on 05/15/22 for the exam, diagnosis, procedures, and orders are all accurate and complete.

## 2022-06-04 IMAGING — CT CT CHEST LUNG CANCER SCREENING LOW DOSE W/O CM
1 of 2 series · 10 of 20 positions shown, 13 images · non-contrast
Comparison: 01/01/2020

CLINICAL DATA: 64-year-old male with 49 pack year history of
smoking. Lung cancer screening.

EXAM:
CT CHEST WITHOUT CONTRAST LOW-DOSE FOR LUNG CANCER SCREENING
TECHNIQUE: Multidetector CT imaging of the chest was performed following the
standard protocol without IV contrast.

[ct lung segmentation data · axial · 0.61mm/px · z∈[+1137,+1137]mm · 10 of 279 frames shown]
[frame 1/279  mediastinal]
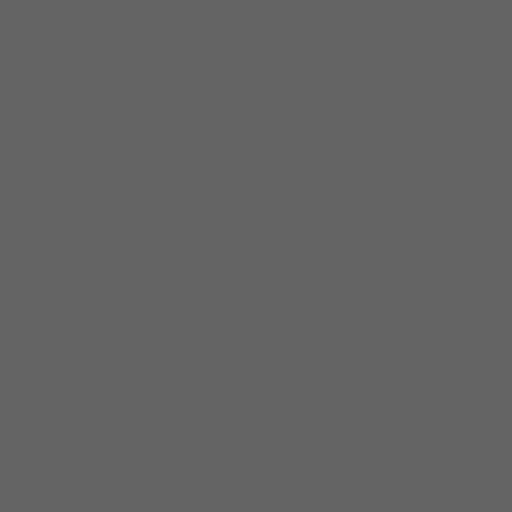
[frame 1/279  lung]
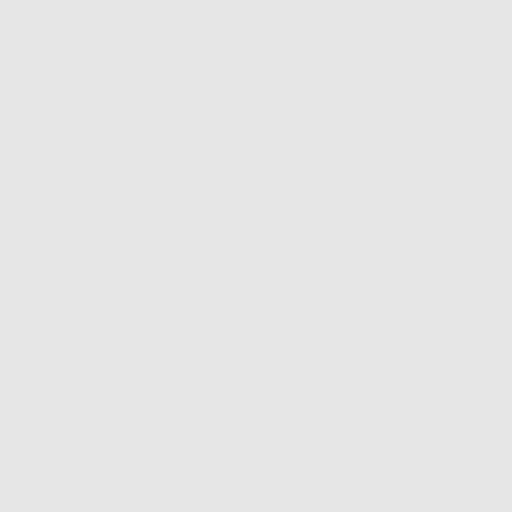
[frame 31/279  lung]
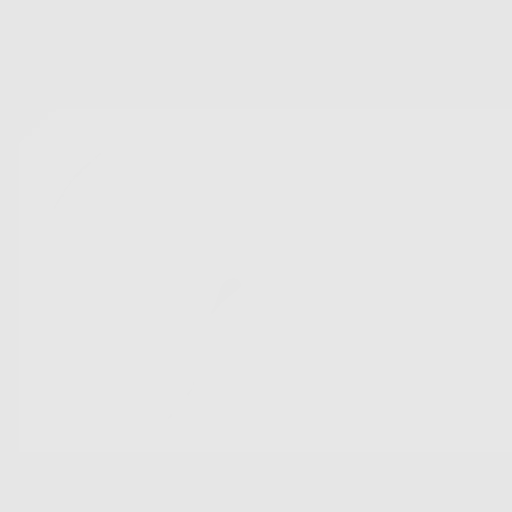
[frame 62/279  lung]
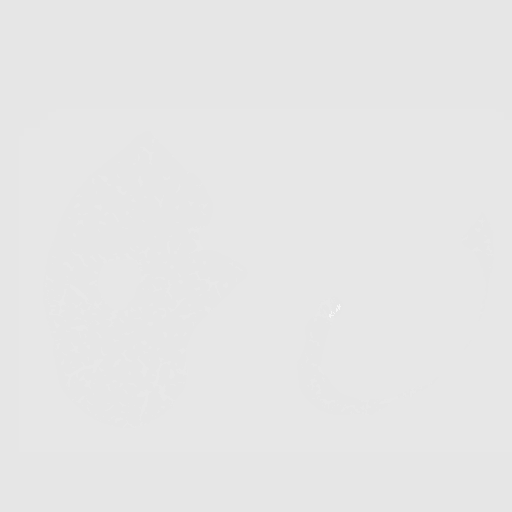
[frame 93/279  lung]
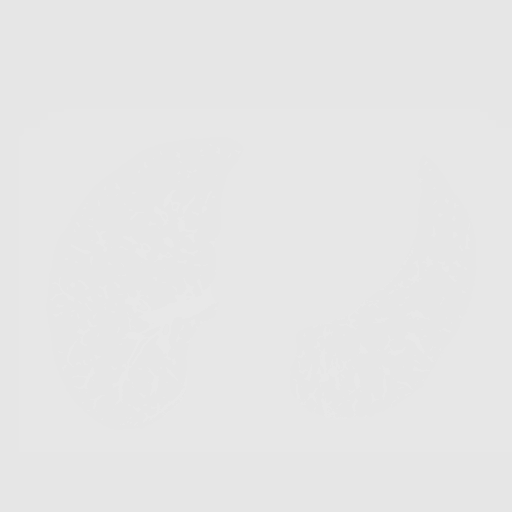
[frame 124/279  mediastinal]
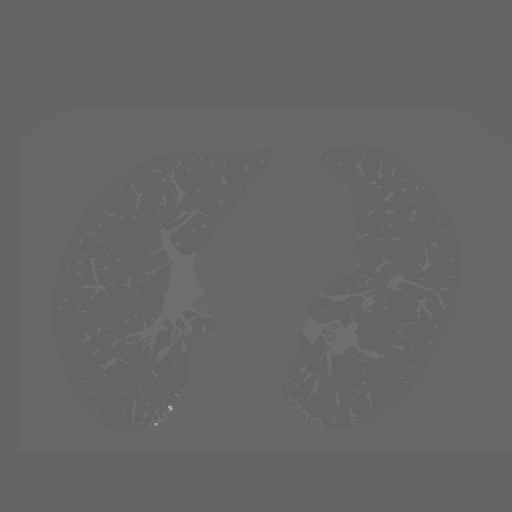
[frame 124/279  lung]
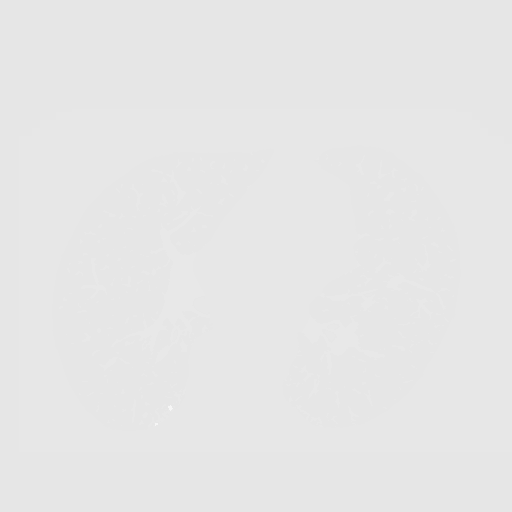
[frame 155/279  lung]
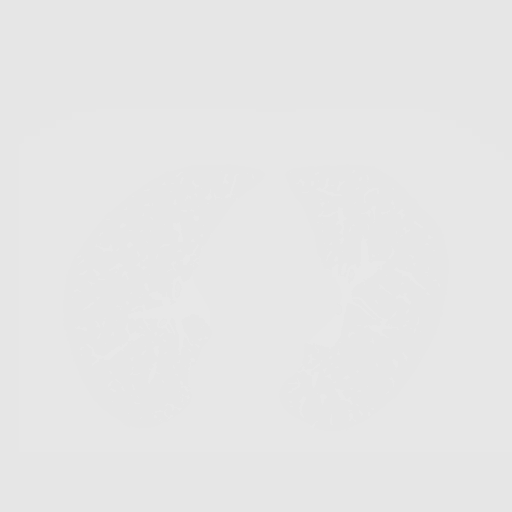
[frame 186/279  lung]
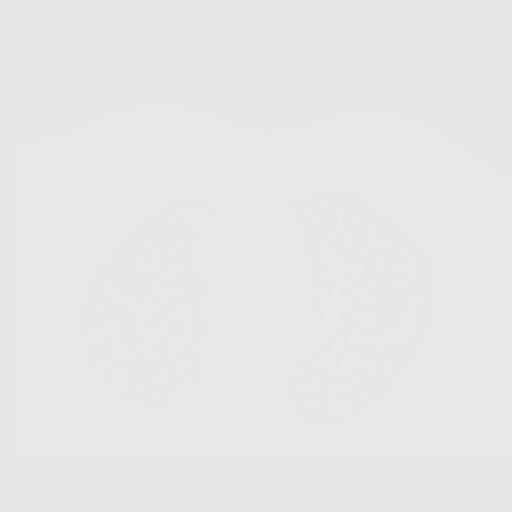
[frame 217/279  lung]
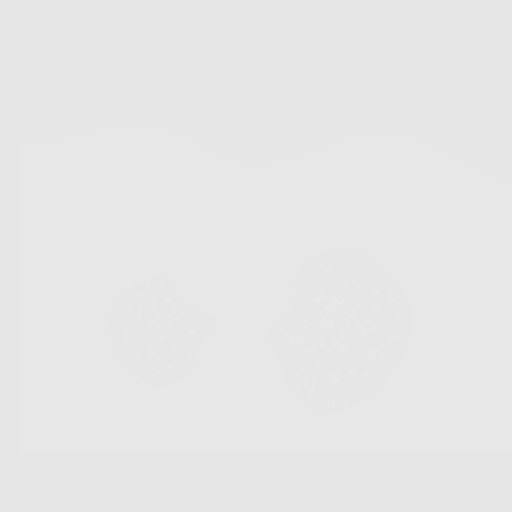
[frame 248/279  mediastinal]
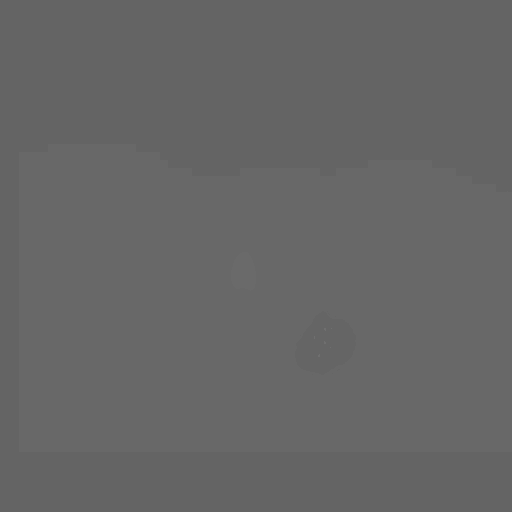
[frame 248/279  lung]
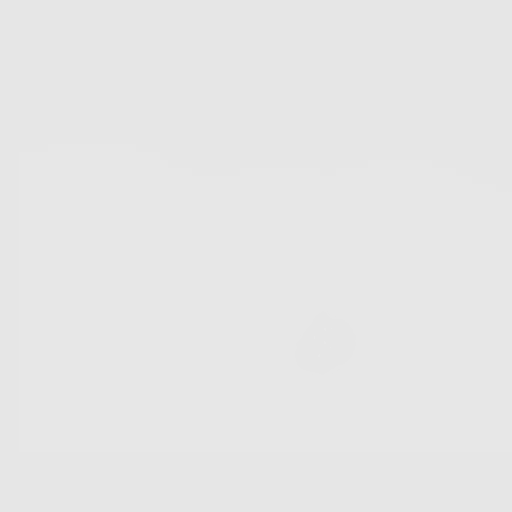
[frame 279/279  lung]
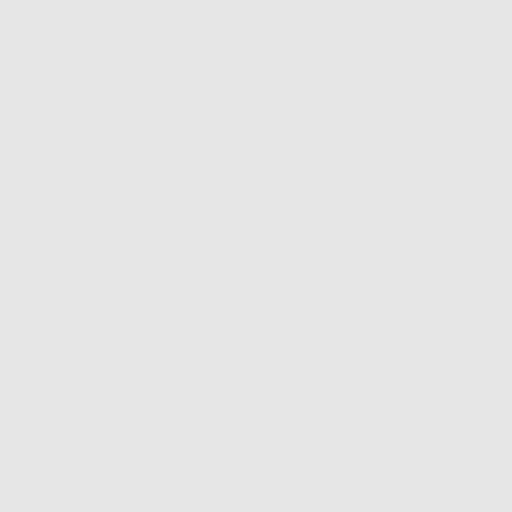

[10 of 20 positions shown; findings below may reference images not displayed]

FINDINGS: Cardiovascular: The heart size is normal. No substantial pericardial
effusion. Atherosclerotic calcification is noted in the wall of the
thoracic aorta.

Mediastinum/Nodes: No mediastinal lymphadenopathy. No evidence for
gross hilar lymphadenopathy although assessment is limited by the
lack of intravenous contrast on today's study. The esophagus has
normal imaging features. There is no axillary lymphadenopathy.

Lungs/Pleura: Centrilobular emphsyema noted. Previously identified
tiny pulmonary nodules are stable. No new suspicious nodule or mass.
No focal airspace consolidation. No pleural effusion.

Upper Abdomen: Unremarkable

Musculoskeletal: No worrisome lytic or sclerotic osseous
abnormality.
IMPRESSION: 1. Lung-RADS 2, benign appearance or behavior. Continue annual
screening with low-dose chest CT without contrast in 12 months.
2.  Emphysema (KF6ZW-CM6.N) and Aortic Atherosclerosis (KF6ZW-170.0)

## 2022-06-22 DIAGNOSIS — I10 Essential (primary) hypertension: Secondary | ICD-10-CM | POA: Diagnosis not present

## 2022-06-22 DIAGNOSIS — J439 Emphysema, unspecified: Secondary | ICD-10-CM | POA: Diagnosis not present

## 2022-06-22 DIAGNOSIS — I700 Atherosclerosis of aorta: Secondary | ICD-10-CM | POA: Diagnosis not present

## 2022-06-22 DIAGNOSIS — E785 Hyperlipidemia, unspecified: Secondary | ICD-10-CM | POA: Diagnosis not present

## 2022-06-22 DIAGNOSIS — F17210 Nicotine dependence, cigarettes, uncomplicated: Secondary | ICD-10-CM | POA: Diagnosis not present

## 2022-08-11 DIAGNOSIS — E785 Hyperlipidemia, unspecified: Secondary | ICD-10-CM | POA: Diagnosis not present

## 2022-08-11 DIAGNOSIS — M4807 Spinal stenosis, lumbosacral region: Secondary | ICD-10-CM | POA: Diagnosis not present

## 2022-08-11 DIAGNOSIS — I10 Essential (primary) hypertension: Secondary | ICD-10-CM | POA: Diagnosis not present

## 2022-10-19 DIAGNOSIS — K429 Umbilical hernia without obstruction or gangrene: Secondary | ICD-10-CM | POA: Diagnosis not present

## 2022-10-19 DIAGNOSIS — I10 Essential (primary) hypertension: Secondary | ICD-10-CM | POA: Diagnosis not present

## 2022-10-19 DIAGNOSIS — J439 Emphysema, unspecified: Secondary | ICD-10-CM | POA: Diagnosis not present

## 2022-10-19 DIAGNOSIS — E785 Hyperlipidemia, unspecified: Secondary | ICD-10-CM | POA: Diagnosis not present

## 2022-10-19 DIAGNOSIS — F17210 Nicotine dependence, cigarettes, uncomplicated: Secondary | ICD-10-CM | POA: Diagnosis not present

## 2022-11-24 DIAGNOSIS — L280 Lichen simplex chronicus: Secondary | ICD-10-CM | POA: Diagnosis not present

## 2022-11-24 DIAGNOSIS — B078 Other viral warts: Secondary | ICD-10-CM | POA: Diagnosis not present

## 2022-12-15 DIAGNOSIS — H10013 Acute follicular conjunctivitis, bilateral: Secondary | ICD-10-CM | POA: Diagnosis not present

## 2022-12-22 DIAGNOSIS — M25561 Pain in right knee: Secondary | ICD-10-CM | POA: Diagnosis not present

## 2022-12-22 DIAGNOSIS — H10013 Acute follicular conjunctivitis, bilateral: Secondary | ICD-10-CM | POA: Diagnosis not present

## 2023-01-28 DIAGNOSIS — H10013 Acute follicular conjunctivitis, bilateral: Secondary | ICD-10-CM | POA: Diagnosis not present

## 2023-01-28 DIAGNOSIS — H35412 Lattice degeneration of retina, left eye: Secondary | ICD-10-CM | POA: Diagnosis not present

## 2023-01-28 DIAGNOSIS — H0102A Squamous blepharitis right eye, upper and lower eyelids: Secondary | ICD-10-CM | POA: Diagnosis not present

## 2023-01-28 DIAGNOSIS — H2513 Age-related nuclear cataract, bilateral: Secondary | ICD-10-CM | POA: Diagnosis not present

## 2023-01-28 DIAGNOSIS — H0102B Squamous blepharitis left eye, upper and lower eyelids: Secondary | ICD-10-CM | POA: Diagnosis not present

## 2023-01-28 DIAGNOSIS — Q100 Congenital ptosis: Secondary | ICD-10-CM | POA: Diagnosis not present

## 2023-01-28 DIAGNOSIS — H04123 Dry eye syndrome of bilateral lacrimal glands: Secondary | ICD-10-CM | POA: Diagnosis not present

## 2023-02-01 ENCOUNTER — Encounter (INDEPENDENT_AMBULATORY_CARE_PROVIDER_SITE_OTHER): Payer: Medicare Other | Admitting: Ophthalmology

## 2023-02-10 ENCOUNTER — Encounter (INDEPENDENT_AMBULATORY_CARE_PROVIDER_SITE_OTHER): Payer: Medicare Other | Admitting: Ophthalmology

## 2023-02-15 DIAGNOSIS — E785 Hyperlipidemia, unspecified: Secondary | ICD-10-CM | POA: Diagnosis not present

## 2023-02-15 DIAGNOSIS — K429 Umbilical hernia without obstruction or gangrene: Secondary | ICD-10-CM | POA: Diagnosis not present

## 2023-02-15 DIAGNOSIS — Z720 Tobacco use: Secondary | ICD-10-CM | POA: Diagnosis not present

## 2023-02-15 DIAGNOSIS — I10 Essential (primary) hypertension: Secondary | ICD-10-CM | POA: Diagnosis not present

## 2023-02-15 DIAGNOSIS — J439 Emphysema, unspecified: Secondary | ICD-10-CM | POA: Diagnosis not present

## 2023-02-15 DIAGNOSIS — K580 Irritable bowel syndrome with diarrhea: Secondary | ICD-10-CM | POA: Diagnosis not present

## 2023-02-15 DIAGNOSIS — M1711 Unilateral primary osteoarthritis, right knee: Secondary | ICD-10-CM | POA: Diagnosis not present

## 2023-02-15 DIAGNOSIS — E7800 Pure hypercholesterolemia, unspecified: Secondary | ICD-10-CM | POA: Diagnosis not present

## 2023-02-15 DIAGNOSIS — R739 Hyperglycemia, unspecified: Secondary | ICD-10-CM | POA: Diagnosis not present

## 2023-02-16 DIAGNOSIS — M1711 Unilateral primary osteoarthritis, right knee: Secondary | ICD-10-CM | POA: Diagnosis not present

## 2023-02-17 ENCOUNTER — Encounter (INDEPENDENT_AMBULATORY_CARE_PROVIDER_SITE_OTHER): Payer: Medicare Other | Admitting: Ophthalmology

## 2023-02-23 DIAGNOSIS — M1711 Unilateral primary osteoarthritis, right knee: Secondary | ICD-10-CM | POA: Diagnosis not present

## 2023-03-01 DIAGNOSIS — R7303 Prediabetes: Secondary | ICD-10-CM | POA: Diagnosis not present

## 2023-03-01 DIAGNOSIS — Z0000 Encounter for general adult medical examination without abnormal findings: Secondary | ICD-10-CM | POA: Diagnosis not present

## 2023-03-02 DIAGNOSIS — M1711 Unilateral primary osteoarthritis, right knee: Secondary | ICD-10-CM | POA: Diagnosis not present

## 2023-03-09 DIAGNOSIS — M1711 Unilateral primary osteoarthritis, right knee: Secondary | ICD-10-CM | POA: Diagnosis not present

## 2023-03-12 DIAGNOSIS — E785 Hyperlipidemia, unspecified: Secondary | ICD-10-CM | POA: Diagnosis not present

## 2023-03-12 DIAGNOSIS — I10 Essential (primary) hypertension: Secondary | ICD-10-CM | POA: Diagnosis not present

## 2023-03-16 DIAGNOSIS — M1711 Unilateral primary osteoarthritis, right knee: Secondary | ICD-10-CM | POA: Diagnosis not present

## 2023-03-17 ENCOUNTER — Encounter: Payer: Self-pay | Admitting: Gastroenterology

## 2023-03-17 ENCOUNTER — Ambulatory Visit (INDEPENDENT_AMBULATORY_CARE_PROVIDER_SITE_OTHER): Payer: Medicare Other | Admitting: Gastroenterology

## 2023-03-17 VITALS — BP 134/67 | HR 76 | Temp 97.9°F | Ht 69.0 in | Wt 227.6 lb

## 2023-03-17 DIAGNOSIS — K219 Gastro-esophageal reflux disease without esophagitis: Secondary | ICD-10-CM

## 2023-03-17 DIAGNOSIS — Z86010 Personal history of colonic polyps: Secondary | ICD-10-CM

## 2023-03-17 MED ORDER — ESOMEPRAZOLE MAGNESIUM 40 MG PO CPDR: 40.0000 mg | DELAYED_RELEASE_CAPSULE | Freq: Every day | ORAL | 3 refills | Status: AC | PRN

## 2023-03-17 NOTE — Patient Instructions (Signed)
Continue nexium 40mg  daily as needed for acid reflux. Colonoscopy to be scheduled.

## 2023-03-17 NOTE — Progress Notes (Signed)
GI Office Note    Referring Provider: Mirna Mires, MD Primary Care Physician:  Mirna Mires, MD  Primary Gastroenterologist: Hennie Duos. Marletta Lor, DO   Chief Complaint   Chief Complaint  Patient presents with   Diarrhea    Was having issues with diarrhea 3 weeks ago, better now. Needs refills on esomeprazole. Due for colonoscopy.     History of Present Illness   [name] is a 67 y.o. male presenting today to schedule 3 year surveillance colonoscopy for history of adenomatous colon polyps.  He also had marginal bowel prep in 2021, received MiraLAX prep in the setting of prep shortages.  Three weeks ago he is having issues with belching/flatulence/diarrhea that last for few days but then cleared up on its own.  He suspects it might been something he ate.  Bowel movements back to normal.  Typically 2-3 stools per day.  No melena or rectal bleeding.  Has heartburn on occasion, typically food triggers such as spaghetti.  May have heartburn if he eats late and lays down.  Typically takes Nexium as needed. No dysphagia. No abdominal pain. BM regular. No melena, brbpr.   Medications   Current Outpatient Medications  Medication Sig Dispense Refill   amLODipine (NORVASC) 5 MG tablet Take 5 mg by mouth daily.      aspirin EC 81 MG tablet Take 81 mg by mouth daily.     atorvastatin (LIPITOR) 20 MG tablet Take 20 mg by mouth at bedtime.     clobetasol ointment (TEMOVATE) 0.05 % Apply to lesions on arms and hands nightly 60 g 1   esomeprazole (NEXIUM) 40 MG capsule 1 po 30 mins prior to your first meal TO PREVENT ULCERS AND REFLUX STRICTURES (Patient taking differently: Take 40 mg by mouth daily as needed (acid reflux).) 90 capsule 3   fluticasone (FLONASE) 50 MCG/ACT nasal spray Place 2 sprays into both nostrils daily as needed for allergies or rhinitis.     ibuprofen (ADVIL) 800 MG tablet Take 800 mg by mouth every 8 (eight) hours as needed for moderate pain.      lisinopril-hydrochlorothiazide (PRINZIDE,ZESTORETIC) 20-12.5 MG tablet Take 2 tablets by mouth daily.      loratadine (CLARITIN) 10 MG tablet Take 10 mg by mouth daily.     No current facility-administered medications for this visit.    Allergies   Allergies as of 03/17/2023   (No Known Allergies)    Past Medical History   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)    Chronic hepatitis C (HCC)    successfully treated at [facility], pretreatment liver biopsy with no advance disease   Cocaine dependence (HCC)    remote   Dysphagia    Hepatitis    Hypertension    Personal history of colonic polyps     Past Surgical History   Past Surgical History:  Procedure Laterality Date   BIOPSY  05/05/2016   Procedure: BIOPSY;  Surgeon: West Bali, MD;  Location: [facility];  Service: Endoscopy;;  transverse colon polyp   COLONOSCOPY     SLF: Multiple polyps, simple adenoma. NEXT TCS 04/2019   COLONOSCOPY WITH PROPOFOL N/A 05/05/2016   Procedure: COLONOSCOPY WITH PROPOFOL;  Surgeon: West Bali, MD;  Location: [facility];  Service: Endoscopy;  Laterality: N/A;  1000   COLONOSCOPY WITH PROPOFOL N/A 06/04/2020  Procedure: COLONOSCOPY WITH PROPOFOL;  Surgeon: Lanelle Bal, DO;  Location: [facility];  Service: Endoscopy;  Laterality: N/A;  11:15am   ESOPHAGOGASTRODUODENOSCOPY  2011   Dr. Darrick Penna: Probable esophageal web status post dilation, gastritis.  Biopsies benign, no H. pylori   KNEE ARTHROPLASTY Left    KNEE ARTHROSCOPY Right    POLYPECTOMY  05/05/2016   Procedure: POLYPECTOMY;  Surgeon: West Bali, MD;  Location: [facility];  Service: Endoscopy;;  transverse colon, descending colon and sigmoid colon polyps   POLYPECTOMY  06/04/2020   Procedure: POLYPECTOMY;  Surgeon: Lanelle Bal, DO;  Location: [facility];  Service: Endoscopy;;   TRIGGER FINGER RELEASE Left 2010    Past Family  History   Family History  Problem Relation Age of Onset   Colon cancer Neg Hx     Past Social History   Social History   Socioeconomic History   Marital status: Married    Spouse name: Not on file   Number of children: Not on file   Years of education: Not on file   Highest education level: Not on file  Occupational History   Not on file  Tobacco Use   Smoking status: Every Day    Packs/day: 1    Types: Cigarettes   Smokeless tobacco: Never  Vaping Use   Vaping Use: Never used  Substance and Sexual Activity   Alcohol use: Yes    Comment: once a week, vodka, less than 8 ounce. not every week.   Drug use: Not Currently    Types: Marijuana    Comment: Occasional marijuana   Sexual activity: Yes  Other Topics Concern   Not on file  Social History Narrative   Not on file   Social Determinants of Health   Financial Resource Strain: Not on file  Food Insecurity: Not on file  Transportation Needs: Not on file  Physical Activity: Not on file  Stress: Not on file  Social Connections: Not on file  Intimate Partner Violence: Not on file    Review of Systems   General: Negative for anorexia, weight loss, fever, chills, fatigue, weakness. Eyes: Negative for vision changes.  ENT: Negative for hoarseness, difficulty swallowing, nasal congestion. CV: Negative for chest pain, angina, palpitations, dyspnea on exertion, peripheral edema.  Respiratory: Negative for dyspnea at rest, dyspnea on exertion, cough, sputum, wheezing.  GI: See history of present illness. GU:  Negative for dysuria, hematuria, urinary incontinence, urinary frequency, nocturnal urination.  MS: knee/joint pain,no low back pain.  Derm: Negative for rash or itching.  Neuro: Negative for weakness, abnormal sensation, seizure, frequent headaches, memory loss,  confusion.  Psych: Negative for anxiety, depression, suicidal ideation, hallucinations.  Endo: Negative for unusual weight change.  Heme: Negative  for bruising or bleeding. Allergy: Negative for rash or hives.  Physical Exam   BP 134/67 (BP Location: Right Arm, Patient Position: Sitting, Cuff Size: Large)   Pulse 76   Temp 97.9 F (36.6 C) (Oral)   Ht 5\' 9"  (1.753 m)   Wt 227 lb 9.6 oz (103.2 kg)   SpO2 98%   BMI 33.61 kg/m    General: Well-nourished, well-developed in no acute distress.  Head: Normocephalic, atraumatic.   Eyes: Conjunctiva pink, no icterus. Mouth: Oropharyngeal mucosa moist and pink  Neck: Supple without thyromegaly, masses, or lymphadenopathy.  Lungs: Clear to auscultation bilaterally.  Heart: Regular rate and rhythm, no murmurs rubs or gallops.  Abdomen: Bowel sounds are normal, nontender, nondistended, no hepatosplenomegaly  or masses,  no abdominal bruits or hernia, no rebound or guarding.   Rectal: not performed Extremities: No lower extremity edema. No clubbing or deformities.  Neuro: Alert and oriented x 4 , grossly normal neurologically.  Skin: Warm and dry, no rash or jaundice.   Psych: Alert and cooperative, normal mood and affect.  Labs   Lab Results  Component Value Date   WBC 12.0 (H) 05/06/2022   HGB 15.6 05/06/2022   HCT 44.8 05/06/2022   MCV 86.7 05/06/2022   PLT 360 05/06/2022    Imaging Studies   No results found.  Assessment   H/O adenomatous colon polyps: due for surveillance colonoscopy.   GERD: well controlled with dietary changes. Uses PPI on occasion. No alarm symptoms.      PLAN   Colonoscopy to be scheduled. ASA 3.  I have discussed the risks, alternatives, benefits with regards to but not limited to the risk of reaction to medication, bleeding, infection, perforation and the patient is agreeable to proceed. Written consent to be obtained. Continue omeprazole 40mg  daily before breakfast.   Leanna Battles. Melvyn Neth, MHS, PA-C [facility]

## 2023-03-29 ENCOUNTER — Other Ambulatory Visit (HOSPITAL_COMMUNITY): Payer: Self-pay | Admitting: Family Medicine

## 2023-03-29 DIAGNOSIS — F17200 Nicotine dependence, unspecified, uncomplicated: Secondary | ICD-10-CM

## 2023-03-29 DIAGNOSIS — E1169 Type 2 diabetes mellitus with other specified complication: Secondary | ICD-10-CM | POA: Diagnosis not present

## 2023-04-27 DIAGNOSIS — E1165 Type 2 diabetes mellitus with hyperglycemia: Secondary | ICD-10-CM | POA: Diagnosis not present

## 2023-04-27 DIAGNOSIS — M545 Low back pain, unspecified: Secondary | ICD-10-CM | POA: Diagnosis not present

## 2023-04-30 ENCOUNTER — Other Ambulatory Visit: Payer: Self-pay | Admitting: *Deleted

## 2023-04-30 ENCOUNTER — Encounter: Payer: Self-pay | Admitting: *Deleted

## 2023-04-30 MED ORDER — PEG 3350-KCL-NA BICARB-NACL 420 G PO SOLR: 4000.0000 mL | Freq: Once | ORAL | 0 refills | Status: AC

## 2023-05-03 ENCOUNTER — Encounter: Payer: Self-pay | Admitting: *Deleted

## 2023-05-06 ENCOUNTER — Ambulatory Visit (HOSPITAL_COMMUNITY)
Admission: RE | Admit: 2023-05-06 | Discharge: 2023-05-06 | Disposition: A | Discharge status: Home / Self Care | Payer: Medicare Other | Source: Ambulatory Visit | Attending: Family Medicine | Admitting: Family Medicine

## 2023-05-06 DIAGNOSIS — F17210 Nicotine dependence, cigarettes, uncomplicated: Secondary | ICD-10-CM | POA: Insufficient documentation

## 2023-05-06 DIAGNOSIS — Z122 Encounter for screening for malignant neoplasm of respiratory organs: Secondary | ICD-10-CM | POA: Diagnosis not present

## 2023-05-06 DIAGNOSIS — I700 Atherosclerosis of aorta: Secondary | ICD-10-CM | POA: Insufficient documentation

## 2023-05-06 DIAGNOSIS — J439 Emphysema, unspecified: Secondary | ICD-10-CM | POA: Insufficient documentation

## 2023-05-06 DIAGNOSIS — F17200 Nicotine dependence, unspecified, uncomplicated: Secondary | ICD-10-CM | POA: Insufficient documentation

## 2023-05-26 NOTE — Patient Instructions (Signed)
BODI LUMBARD  05/26/2023     @PREFPERIOPPHARMACY @   Your procedure is scheduled on  05/31/2023.   Report to [facility] at  0600  A.M.   Call this number if you have problems the morning of surgery:  [phone]  If you experience any cold or flu symptoms such as cough, fever, chills, shortness of breath, etc. between now and your scheduled surgery, please notify us at the above number.   Remember:  Follow the diet and prep instructions given to you by the office.      Take these medicines the morning of surgery with A SIP OF WATER                        amlodipine, nexium, claritin.     Do not wear jewelry, make-up or nail polish, including gel polish,  artificial nails, or any other type of covering on natural nails (fingers and  toes).  Do not wear lotions, powders, or perfumes, or deodorant.  Do not shave 48 hours prior to surgery.  Men may shave face and neck.  Do not bring valuables to the hospital.  [facility] is not responsible for any belongings or valuables.  Contacts, dentures or bridgework may not be worn into surgery.  Leave your suitcase in the car.  After surgery it may be brought to your room.  For patients admitted to the hospital, discharge time will be determined by your treatment team.  Patients discharged the day of surgery will not be allowed to drive home and must have someone with them for 24 hours.    Special instructions:   DO NOT smoke tobacco or vape for 24 hours before your procedure.  Please read over the following fact sheets that you were given. Anesthesia Post-op Instructions and Care and Recovery After Surgery       Colonoscopy, Adult, Care After The following information offers guidance on how to care for yourself after your procedure. Your health care provider may also give you more specific instructions. If you have problems or questions, contact your health care provider. What can I expect after the procedure? After  the procedure, it is common to have: A small amount of blood in your stool for 24 hours after the procedure. Some gas. Mild cramping or bloating of your abdomen. Follow these instructions at home: Eating and drinking  Drink enough fluid to keep your urine pale yellow. Follow instructions from your health care provider about eating or drinking restrictions. Resume your normal diet as told by your health care provider. Avoid heavy or fried foods that are hard to digest. Activity Rest as told by your health care provider. Avoid sitting for a long time without moving. Get up to take short walks every 1-2 hours. This is important to improve blood flow and breathing. Ask for help if you feel weak or unsteady. Return to your normal activities as told by your health care provider. Ask your health care provider what activities are safe for you. Managing cramping and bloating  Try walking around when you have cramps or feel bloated. If directed, apply heat to your abdomen as told by your health care provider. Use the heat source that your health care provider recommends, such as a moist heat pack or a heating pad. Place a towel between your skin and the heat source. Leave the heat on for 20-30 minutes. Remove the heat if your skin turns bright red.  This is especially important if you are unable to feel pain, heat, or cold. You have a greater risk of getting burned. General instructions If you were given a sedative during the procedure, it can affect you for several hours. Do not drive or operate machinery until your health care provider says that it is safe. For the first 24 hours after the procedure: Do not sign important documents. Do not drink alcohol. Do your regular daily activities at a slower pace than normal. Eat soft foods that are easy to digest. Take over-the-counter and prescription medicines only as told by your health care provider. Keep all follow-up visits. This is  important. Contact a health care provider if: You have blood in your stool 2-3 days after the procedure. Get help right away if: You have more than a small spotting of blood in your stool. You have large blood clots in your stool. You have swelling of your abdomen. You have nausea or vomiting. You have a fever. You have increasing pain in your abdomen that is not relieved with medicine. These symptoms may be an emergency. Get help right away. Call 911. Do not wait to see if the symptoms will go away. Do not drive yourself to the hospital. Summary After the procedure, it is common to have a small amount of blood in your stool. You may also have mild cramping and bloating of your abdomen. If you were given a sedative during the procedure, it can affect you for several hours. Do not drive or operate machinery until your health care provider says that it is safe. Get help right away if you have a lot of blood in your stool, nausea or vomiting, a fever, or increased pain in your abdomen. This information is not intended to replace advice given to you by your health care provider. Make sure you discuss any questions you have with your health care provider. Document Revised: 11/10/2022 Document Reviewed: 05/21/2021 Elsevier Patient Education  2024 [employer] Monitored Anesthesia Care, Care After The following information offers guidance on how to care for yourself after your procedure. Your health care provider may also give you more specific instructions. If you have problems or questions, contact your health care provider. What can I expect after the procedure? After the procedure, it is common to have: Tiredness. Little or no memory about what happened during or after the procedure. Impaired judgment when it comes to making decisions. Nausea or vomiting. Some trouble with balance. Follow these instructions at home: For the time period you were told by your health care  provider:  Rest. Do not participate in activities where you could fall or become injured. Do not drive or use machinery. Do not drink alcohol. Do not take sleeping pills or medicines that cause drowsiness. Do not make important decisions or sign legal documents. Do not take care of children on your own. Medicines Take over-the-counter and prescription medicines only as told by your health care provider. If you were prescribed antibiotics, take them as told by your health care provider. Do not stop using the antibiotic even if you start to feel better. Eating and drinking Follow instructions from your health care provider about what you may eat and drink. Drink enough fluid to keep your urine pale yellow. If you vomit: Drink clear fluids slowly and in small amounts as you are able. Clear fluids include water, ice chips, low-calorie sports drinks, and fruit juice that has water added to it (diluted fruit juice). Eat light and  bland foods in small amounts as you are able. These foods include bananas, applesauce, rice, lean meats, toast, and crackers. General instructions  Have a responsible adult stay with you for the time you are told. It is important to have someone help care for you until you are awake and alert. If you have sleep apnea, surgery and some medicines can increase your risk for breathing problems. Follow instructions from your health care provider about wearing your sleep device: When you are sleeping. This includes during daytime naps. While taking prescription pain medicines, sleeping medicines, or medicines that make you drowsy. Do not use any products that contain nicotine or tobacco. These products include cigarettes, chewing tobacco, and vaping devices, such as e-cigarettes. If you need help quitting, ask your health care provider. Contact a health care provider if: You feel nauseous or vomit every time you eat or drink. You feel light-headed. You are still sleepy or  having trouble with balance after 24 hours. You get a rash. You have a fever. You have redness or swelling around the IV site. Get help right away if: You have trouble breathing. You have new confusion after you get home. These symptoms may be an emergency. Get help right away. Call 911. Do not wait to see if the symptoms will go away. Do not drive yourself to the hospital. This information is not intended to replace advice given to you by your health care provider. Make sure you discuss any questions you have with your health care provider. Document Revised: 02/23/2022 Document Reviewed: 02/23/2022 Elsevier Patient Education  2024 [employer].

## 2023-05-27 ENCOUNTER — Telehealth: Payer: Self-pay | Admitting: *Deleted

## 2023-05-27 ENCOUNTER — Encounter (HOSPITAL_COMMUNITY)
Admission: RE | Admit: 2023-05-27 | Discharge: 2023-05-27 | Disposition: A | Discharge status: Home / Self Care | Payer: Medicare Other | Source: Ambulatory Visit | Attending: Internal Medicine | Admitting: Internal Medicine

## 2023-05-27 ENCOUNTER — Encounter: Payer: Self-pay | Admitting: *Deleted

## 2023-05-27 DIAGNOSIS — I10 Essential (primary) hypertension: Secondary | ICD-10-CM

## 2023-05-27 DIAGNOSIS — B182 Chronic viral hepatitis C: Secondary | ICD-10-CM

## 2023-05-27 NOTE — Telephone Encounter (Signed)
Called pt and he has been given the pre-op appt for 9/25

## 2023-05-27 NOTE — Telephone Encounter (Signed)
Pt called. He missed his pre-op appt. He didn't get his instructions. [facility] He just doesn't have time to go for pre-op appt. He has been rescheduled for procedure to 9/30 at 730am. He already has his prep at home.

## 2023-05-28 ENCOUNTER — Encounter (HOSPITAL_COMMUNITY): Payer: Medicare Other

## 2023-06-07 DIAGNOSIS — E1169 Type 2 diabetes mellitus with other specified complication: Secondary | ICD-10-CM | POA: Diagnosis not present

## 2023-06-07 DIAGNOSIS — J301 Allergic rhinitis due to pollen: Secondary | ICD-10-CM | POA: Diagnosis not present

## 2023-06-16 DIAGNOSIS — B078 Other viral warts: Secondary | ICD-10-CM | POA: Diagnosis not present

## 2023-07-05 NOTE — Patient Instructions (Signed)
[name]  07/05/2023     @PREFPERIOPPHARMACY @   Your procedure is scheduled on  07/12/2023.   Report to [facility] at  0600  A.M.   Call this number if you have problems the morning of surgery:  [phone]  If you experience any cold or flu symptoms such as cough, fever, chills, shortness of breath, etc. between now and your scheduled surgery, please notify us at the above number.   Remember:  Follow the diet and prep instructions given to you by the office.     Take these medicines the morning of surgery with A SIP OF WATER                         amlodipine, nexium, claritin.     Do not wear jewelry, make-up or nail polish, including gel polish,  artificial nails, or any other type of covering on natural nails (fingers and  toes).  Do not wear lotions, powders, or perfumes, or deodorant.  Do not shave 48 hours prior to surgery.  Men may shave face and neck.  Do not bring valuables to the hospital.  [facility] is not responsible for any belongings or valuables.  Contacts, dentures or bridgework may not be worn into surgery.  Leave your suitcase in the car.  After surgery it may be brought to your room.  For patients admitted to the hospital, discharge time will be determined by your treatment team.  Patients discharged the day of surgery will not be allowed to drive home and must have someone with them for 24 hours.    Special instructions:   DO NOT smoke tobacco or vape for 24 hours before your procedure.  Please read over the following fact sheets that you were given. Anesthesia Post-op Instructions and Care and Recovery After Surgery      Colonoscopy, Adult, Care After The following information offers guidance on how to care for yourself after your procedure. Your health care provider may also give you more specific instructions. If you have problems or questions, contact your health care provider. What can I expect after the procedure? After  the procedure, it is common to have: A small amount of blood in your stool for 24 hours after the procedure. Some gas. Mild cramping or bloating of your abdomen. Follow these instructions at home: Eating and drinking  Drink enough fluid to keep your urine pale yellow. Follow instructions from your health care provider about eating or drinking restrictions. Resume your normal diet as told by your health care provider. Avoid heavy or fried foods that are hard to digest. Activity Rest as told by your health care provider. Avoid sitting for a long time without moving. Get up to take short walks every 1-2 hours. This is important to improve blood flow and breathing. Ask for help if you feel weak or unsteady. Return to your normal activities as told by your health care provider. Ask your health care provider what activities are safe for you. Managing cramping and bloating  Try walking around when you have cramps or feel bloated. If directed, apply heat to your abdomen as told by your health care provider. Use the heat source that your health care provider recommends, such as a moist heat pack or a heating pad. Place a towel between your skin and the heat source. Leave the heat on for 20-30 minutes. Remove the heat if your skin turns bright  red. This is especially important if you are unable to feel pain, heat, or cold. You have a greater risk of getting burned. General instructions If you were given a sedative during the procedure, it can affect you for several hours. Do not drive or operate machinery until your health care provider says that it is safe. For the first 24 hours after the procedure: Do not sign important documents. Do not drink alcohol. Do your regular daily activities at a slower pace than normal. Eat soft foods that are easy to digest. Take over-the-counter and prescription medicines only as told by your health care provider. Keep all follow-up visits. This is  important. Contact a health care provider if: You have blood in your stool 2-3 days after the procedure. Get help right away if: You have more than a small spotting of blood in your stool. You have large blood clots in your stool. You have swelling of your abdomen. You have nausea or vomiting. You have a fever. You have increasing pain in your abdomen that is not relieved with medicine. These symptoms may be an emergency. Get help right away. Call 911. Do not wait to see if the symptoms will go away. Do not drive yourself to the hospital. Summary After the procedure, it is common to have a small amount of blood in your stool. You may also have mild cramping and bloating of your abdomen. If you were given a sedative during the procedure, it can affect you for several hours. Do not drive or operate machinery until your health care provider says that it is safe. Get help right away if you have a lot of blood in your stool, nausea or vomiting, a fever, or increased pain in your abdomen. This information is not intended to replace advice given to you by your health care provider. Make sure you discuss any questions you have with your health care provider. Document Revised: 11/10/2022 Document Reviewed: 05/21/2021 Elsevier Patient Education  2024 Else[employer] Inc. Monitored Anesthesia Care, Care After The following information offers guidance on how to care for yourself after your procedure. Your health care provider may also give you more specific instructions. If you have problems or questions, contact your health care provider. What can I expect after the procedure? After the procedure, it is common to have: Tiredness. Little or no memory about what happened during or after the procedure. Impaired judgment when it comes to making decisions. Nausea or vomiting. Some trouble with balance. Follow these instructions at home: For the time period you were told by your health care  provider:  Rest. Do not participate in activities where you could fall or become injured. Do not drive or use machinery. Do not drink alcohol. Do not take sleeping pills or medicines that cause drowsiness. Do not make important decisions or sign legal documents. Do not take care of children on your own. Medicines Take over-the-counter and prescription medicines only as told by your health care provider. If you were prescribed antibiotics, take them as told by your health care provider. Do not stop using the antibiotic even if you start to feel better. Eating and drinking Follow instructions from your health care provider about what you may eat and drink. Drink enough fluid to keep your urine pale yellow. If you vomit: Drink clear fluids slowly and in small amounts as you are able. Clear fluids include water, ice chips, low-calorie sports drinks, and fruit juice that has water added to it (diluted fruit juice). Eat light  and bland foods in small amounts as you are able. These foods include bananas, applesauce, rice, lean meats, toast, and crackers. General instructions  Have a responsible adult stay with you for the time you are told. It is important to have someone help care for you until you are awake and alert. If you have sleep apnea, surgery and some medicines can increase your risk for breathing problems. Follow instructions from your health care provider about wearing your sleep device: When you are sleeping. This includes during daytime naps. While taking prescription pain medicines, sleeping medicines, or medicines that make you drowsy. Do not use any products that contain nicotine or tobacco. These products include cigarettes, chewing tobacco, and vaping devices, such as e-cigarettes. If you need help quitting, ask your health care provider. Contact a health care provider if: You feel nauseous or vomit every time you eat or drink. You feel light-headed. You are still sleepy or  having trouble with balance after 24 hours. You get a rash. You have a fever. You have redness or swelling around the IV site. Get help right away if: You have trouble breathing. You have new confusion after you get home. These symptoms may be an emergency. Get help right away. Call 911. Do not wait to see if the symptoms will go away. Do not drive yourself to the hospital. This information is not intended to replace advice given to you by your health care provider. Make sure you discuss any questions you have with your health care provider. Document Revised: 02/23/2022 Document Reviewed: 02/23/2022 Elsevier Patient Education  2024 [employer].

## 2023-07-07 ENCOUNTER — Encounter (HOSPITAL_COMMUNITY): Payer: Self-pay

## 2023-07-07 ENCOUNTER — Encounter (HOSPITAL_COMMUNITY)
Admission: RE | Admit: 2023-07-07 | Discharge: 2023-07-07 | Disposition: A | Discharge status: Home / Self Care | Payer: Medicare Other | Source: Ambulatory Visit | Attending: Internal Medicine | Admitting: Internal Medicine

## 2023-07-07 DIAGNOSIS — Z01818 Encounter for other preprocedural examination: Secondary | ICD-10-CM | POA: Diagnosis not present

## 2023-07-07 DIAGNOSIS — B182 Chronic viral hepatitis C: Secondary | ICD-10-CM | POA: Insufficient documentation

## 2023-07-07 DIAGNOSIS — I10 Essential (primary) hypertension: Secondary | ICD-10-CM | POA: Insufficient documentation

## 2023-07-07 HISTORY — DX: Type 2 diabetes mellitus without complications: E11.9

## 2023-07-07 LAB — PROTIME-INR
INR: 1 (ref 0.8–1.2)
Prothrombin Time: 13.2 seconds (ref 11.4–15.2)

## 2023-07-07 LAB — CBC WITH DIFFERENTIAL/PLATELET
Abs Immature Granulocytes: 0.05 10*3/uL (ref 0.00–0.07)
Basophils Absolute: 0.1 10*3/uL (ref 0.0–0.1)
Basophils Relative: 1 %
Eosinophils Absolute: 0.6 10*3/uL — ABNORMAL HIGH (ref 0.0–0.5)
Eosinophils Relative: 6 %
HCT: 41.7 % (ref 39.0–52.0)
Hemoglobin: 14.2 g/dL (ref 13.0–17.0)
Immature Granulocytes: 1 %
Lymphocytes Relative: 28 %
Lymphs Abs: 2.7 10*3/uL (ref 0.7–4.0)
MCH: 29.2 pg (ref 26.0–34.0)
MCHC: 34.1 g/dL (ref 30.0–36.0)
MCV: 85.8 fL (ref 80.0–100.0)
Monocytes Absolute: 0.9 10*3/uL (ref 0.1–1.0)
Monocytes Relative: 9 %
Neutro Abs: 5.5 10*3/uL (ref 1.7–7.7)
Neutrophils Relative %: 55 %
Platelets: 314 10*3/uL (ref 150–400)
RBC: 4.86 MIL/uL (ref 4.22–5.81)
RDW: 13.3 % (ref 11.5–15.5)
WBC: 9.8 10*3/uL (ref 4.0–10.5)
nRBC: 0 % (ref 0.0–0.2)

## 2023-07-07 LAB — COMPREHENSIVE METABOLIC PANEL
ALT: 20 U/L (ref 0–44)
AST: 18 U/L (ref 15–41)
Albumin: 3.7 g/dL (ref 3.5–5.0)
Alkaline Phosphatase: 66 U/L (ref 38–126)
Anion gap: 9 (ref 5–15)
BUN: 12 mg/dL (ref 8–23)
CO2: 24 mmol/L (ref 22–32)
Calcium: 8.6 mg/dL — ABNORMAL LOW (ref 8.9–10.3)
Chloride: 103 mmol/L (ref 98–111)
Creatinine, Ser: 0.78 mg/dL (ref 0.61–1.24)
GFR, Estimated: >60 mL/min (ref >60)
Glucose, Bld: 148 mg/dL — ABNORMAL HIGH (ref 70–99)
Potassium: 3.1 mmol/L — ABNORMAL LOW (ref 3.5–5.1)
Sodium: 136 mmol/L (ref 135–145)
Total Bilirubin: 0.3 mg/dL (ref 0.3–1.2)
Total Protein: 7 g/dL (ref 6.5–8.1)

## 2023-07-12 ENCOUNTER — Encounter (HOSPITAL_COMMUNITY)
Admission: RE | Disposition: A | Discharge status: Home / Self Care | Payer: Self-pay | Source: Home / Self Care | Attending: Internal Medicine

## 2023-07-12 ENCOUNTER — Ambulatory Visit (HOSPITAL_COMMUNITY)
Admission: RE | Admit: 2023-07-12 | Discharge: 2023-07-12 | Disposition: A | Discharge status: Home / Self Care | Payer: Medicare Other | Attending: Internal Medicine | Admitting: Internal Medicine

## 2023-07-12 ENCOUNTER — Encounter (HOSPITAL_COMMUNITY): Payer: Self-pay

## 2023-07-12 ENCOUNTER — Ambulatory Visit (HOSPITAL_COMMUNITY): Payer: Medicare Other | Admitting: Anesthesiology

## 2023-07-12 DIAGNOSIS — F17200 Nicotine dependence, unspecified, uncomplicated: Secondary | ICD-10-CM | POA: Insufficient documentation

## 2023-07-12 DIAGNOSIS — Z86010 Personal history of colonic polyps: Secondary | ICD-10-CM

## 2023-07-12 DIAGNOSIS — K648 Other hemorrhoids: Secondary | ICD-10-CM | POA: Insufficient documentation

## 2023-07-12 DIAGNOSIS — Z09 Encounter for follow-up examination after completed treatment for conditions other than malignant neoplasm: Secondary | ICD-10-CM | POA: Insufficient documentation

## 2023-07-12 DIAGNOSIS — J45909 Unspecified asthma, uncomplicated: Secondary | ICD-10-CM | POA: Insufficient documentation

## 2023-07-12 DIAGNOSIS — D126 Benign neoplasm of colon, unspecified: Secondary | ICD-10-CM

## 2023-07-12 DIAGNOSIS — I10 Essential (primary) hypertension: Secondary | ICD-10-CM | POA: Diagnosis not present

## 2023-07-12 DIAGNOSIS — D123 Benign neoplasm of transverse colon: Secondary | ICD-10-CM | POA: Diagnosis not present

## 2023-07-12 DIAGNOSIS — Z1211 Encounter for screening for malignant neoplasm of colon: Secondary | ICD-10-CM | POA: Diagnosis not present

## 2023-07-12 DIAGNOSIS — E119 Type 2 diabetes mellitus without complications: Secondary | ICD-10-CM | POA: Diagnosis not present

## 2023-07-12 DIAGNOSIS — K219 Gastro-esophageal reflux disease without esophagitis: Secondary | ICD-10-CM | POA: Insufficient documentation

## 2023-07-12 DIAGNOSIS — K5730 Diverticulosis of large intestine without perforation or abscess without bleeding: Secondary | ICD-10-CM | POA: Diagnosis not present

## 2023-07-12 DIAGNOSIS — Z7984 Long term (current) use of oral hypoglycemic drugs: Secondary | ICD-10-CM | POA: Diagnosis not present

## 2023-07-12 DIAGNOSIS — D125 Benign neoplasm of sigmoid colon: Secondary | ICD-10-CM | POA: Diagnosis not present

## 2023-07-12 DIAGNOSIS — K635 Polyp of colon: Secondary | ICD-10-CM | POA: Insufficient documentation

## 2023-07-12 DIAGNOSIS — B182 Chronic viral hepatitis C: Secondary | ICD-10-CM | POA: Insufficient documentation

## 2023-07-12 HISTORY — PX: POLYPECTOMY: SHX5525

## 2023-07-12 HISTORY — PX: COLONOSCOPY WITH PROPOFOL: SHX5780

## 2023-07-12 LAB — GLUCOSE, CAPILLARY: Glucose-Capillary: 91 mg/dL (ref 70–99)

## 2023-07-12 SURGERY — COLONOSCOPY WITH PROPOFOL
Anesthesia: General

## 2023-07-12 MED ORDER — PROPOFOL 10 MG/ML IV BOLUS
INTRAVENOUS | Status: DC | PRN
  Administered 2023-07-12: 80 mg via INTRAVENOUS
  Administered 2023-07-12: 50 mg via INTRAVENOUS

## 2023-07-12 MED ORDER — LACTATED RINGERS IV SOLN: INTRAVENOUS | Status: DC

## 2023-07-12 MED ORDER — PROPOFOL 500 MG/50ML IV EMUL
INTRAVENOUS | Status: DC | PRN
  Administered 2023-07-12: 125 ug/kg/min via INTRAVENOUS

## 2023-07-12 NOTE — Op Note (Addendum)
[facility] Patient Name: [name] Procedure Date: 07/12/2023 7:04 AM MRN: [id number] Date of Birth: 1955-11-05 Attending MD: Hennie Duos. Marletta Lor , [place], [id number] CSN: [id number] Age: 67 Admit Type: Outpatient Procedure:                Colonoscopy Indications:              Surveillance: Personal history of adenomatous                            polyps on last colonoscopy 3 years ago Providers:                Hennie Duos. Marletta Lor, DO, Buel Ream. Thomasena Edis RN, RN,                            Dyann Ruddle Referring MD:              Medicines:                See the Anesthesia note for documentation of the                            administered medications Complications:            No immediate complications. Estimated Blood Loss:     Estimated blood loss was minimal. Procedure:                Pre-Anesthesia Assessment:                           - The anesthesia plan was to use monitored                            anesthesia care (MAC).                           After obtaining informed consent, the colonoscope                            was passed under direct vision. Throughout the                            procedure, the patient's blood pressure, pulse, and                            oxygen saturations were monitored continuously. The                            [id number]-HQ190L ([id number]) scope was introduced through                            the anus and advanced to the the cecum, identified                            by appendiceal orifice and ileocecal valve. The                            colonoscopy was performed  without difficulty. The                            patient tolerated the procedure well. The quality                            of the bowel preparation was evaluated using the                            BBPS [facility] Bowel Preparation Scale) with scores                            of: Right Colon = 2 (minor amount of residual                            staining, small  fragments of stool and/or opaque                            liquid, but mucosa seen well), Transverse Colon = 2                            (minor amount of residual staining, small fragments                            of stool and/or opaque liquid, but mucosa seen                            well) and Left Colon = 2 (minor amount of residual                            staining, small fragments of stool and/or opaque                            liquid, but mucosa seen well). The total BBPS score                            equals 6. Fair. Scope In: 7:39:11 AM Scope Out: 8:01:07 AM Scope Withdrawal Time: 0 hours 19 minutes 17 seconds  Total Procedure Duration: 0 hours 21 minutes 56 seconds  Findings:      Non-bleeding internal hemorrhoids were found during endoscopy.      Multiple large-mouthed and small-mouthed diverticula were found in the       sigmoid colon, descending colon and proximal transverse colon.      Three sessile polyps were found in the transverse colon. The polyps were       3 to 8 mm in size. These polyps were removed with a cold snare.       Resection and retrieval were complete.      A 15 mm polyp was found in the distal transverse colon. The polyp was       pedunculated. The polyp was removed with a cold snare. Resection and       retrieval were complete. For hemostasis, one hemostatic clip was       successfully placed (MR conditional). Clip manufacturer: [employer]. There was no bleeding at the end of the procedure.      Three sessile polyps were found in the sigmoid colon. The polyps were 3       to 4 mm in size. These polyps were removed with a cold snare. Resection       and retrieval were complete. Impression:               - Non-bleeding internal hemorrhoids.                           - Diverticulosis in the sigmoid colon, in the                            descending colon and in the proximal transverse                            colon.                            - Three 3 to 8 mm polyps in the transverse colon,                            removed with a cold snare. Resected and retrieved.                           - One 15 mm polyp in the distal transverse colon,                            removed with a cold snare. Resected and retrieved.                            Clip (MR conditional) was placed. Clip                            manufacturer: [employer].                           - Three 3 to 4 mm polyps in the sigmoid colon,                            removed with a cold snare. Resected and retrieved. Moderate Sedation:      Per Anesthesia Care Recommendation:           - Patient has a contact number available for                            emergencies. The signs and symptoms of potential                            delayed complications were discussed with the                            patient. Return to normal activities tomorrow.  Written discharge instructions were provided to the                            patient.                           - Resume previous diet.                           - Continue present medications.                           - Await pathology results.                           - Repeat colonoscopy in 3 years for surveillance.                           - Return to GI clinic PRN.                           - PATIENT WILL NEED KUB PRIOR TO ANY MRI DONE IN                            THE NEAR FUTURE DUE TO CLIP PLACEMENT TODAY Procedure Code(s):        --- Professional ---                           [phone], Colonoscopy, flexible; with removal of                            tumor(s), polyp(s), or other lesion(s) by snare                            technique Diagnosis Code(s):        --- Professional ---                           Z86.010, Personal history of colonic polyps                           K64.8, Other hemorrhoids                           D12.3, Benign neoplasm of  transverse colon (hepatic                            flexure or splenic flexure)                           D12.5, Benign neoplasm of sigmoid colon                           K57.30, Diverticulosis of large intestine without                            perforation or  abscess without bleeding CPT copyright 2022 American Medical Association. All rights reserved. The codes documented in this report are preliminary and upon coder review may  be revised to meet current compliance requirements. Hennie Duos. Marletta Lor, DO Hennie Duos. Marletta Lor, DO 07/12/2023 8:05:34 AM This report has been signed electronically. Number of Addenda: 0

## 2023-07-12 NOTE — H&P (Signed)
Primary Care Physician:  Mirna Mires, MD Primary Gastroenterologist:  Dr. Marletta Lor  Pre-Procedure History & Physical: HPI:  [name] is a 67 y.o. 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 NEXT TCS 04/2019   COLONOSCOPY WITH PROPOFOL N/A 05/05/2016   Procedure: COLONOSCOPY WITH PROPOFOL;  Surgeon: West Bali, MD;  [facility];  Service: Endoscopy;  Laterality: N/A;  1000   COLONOSCOPY WITH PROPOFOL N/A 06/04/2020   Procedure: COLONOSCOPY WITH PROPOFOL;  Surgeon: Lanelle Bal, DO;  [facility];  Service: Endoscopy;  Laterality: N/A;  11:15am   ESOPHAGOGASTRODUODENOSCOPY  2011   Dr. Darrick Penna: Probable esophageal web status post dilation, gastritis.  Biopsies benign, no H. pylori   KNEE ARTHROPLASTY Left    KNEE ARTHROSCOPY Right    POLYPECTOMY  05/05/2016   Procedure: POLYPECTOMY;  Surgeon: West Bali, MD;  [facility];  Service: Endoscopy;;  transverse colon, descending colon and sigmoid colon polyps   POLYPECTOMY  06/04/2020   Procedure: POLYPECTOMY;  Surgeon: Lanelle Bal, DO;  [facility];  Service: Endoscopy;;   TRIGGER FINGER RELEASE Left 2010    Prior to Admission medications   Medication  Sig Start Date End Date Taking? Authorizing Provider  amLODipine (NORVASC) 5 MG tablet Take 5 mg by mouth daily.  12/17/11  Yes [provider]  aspirin EC 81 MG tablet Take 81 mg by mouth daily.   Yes [provider]  atorvastatin (LIPITOR) 20 MG tablet Take 20 mg by mouth at bedtime. 02/17/23  Yes [provider]  clobetasol ointment (TEMOVATE) 0.05 % Apply to lesions on arms and hands nightly 06/24/21  Yes Janalyn Harder, MD  esomeprazole (NEXIUM) 40 MG capsule Take 1 capsule (40 mg total) by mouth daily as needed (acid reflux). 03/17/23  Yes Tiffany Kocher, PA-C  fluticasone (FLONASE) 50 MCG/ACT nasal spray Place 2 sprays into both nostrils daily as needed for allergies or rhinitis.   Yes [provider]  ibuprofen (ADVIL) 800 MG tablet Take 800 mg by mouth every 8 (eight) hours as needed for moderate pain.   Yes [provider]  lisinopril-hydrochlorothiazide (PRINZIDE,ZESTORETIC) 20-12.5 MG tablet Take 2 tablets by mouth daily.  12/17/11  Yes [provider]  loratadine (CLARITIN) 10 MG tablet Take 10 mg by mouth daily.   Yes [provider]  metFORMIN (GLUCOPHAGE) 500 MG tablet Take 500 mg by mouth 2 (two) times daily with a meal.   Yes [provider]    Allergies as of 04/30/2023   (No Known Allergies)    Family History  Problem Relation Age of Onset  Colon cancer Neg Hx     Social History   Socioeconomic History   Marital status: Married    Spouse name: Not on file   Number of children: Not on file   Years of education: Not on file   Highest education level: Not on file  Occupational History   Not on file  Tobacco Use   Smoking status: Every Day    Current packs/day: 1.00    Types: Cigarettes   Smokeless tobacco: Never  Vaping Use   Vaping status: Never Used  Substance and Sexual Activity   Alcohol use: Yes    Comment: once a week, vodka, less than 8 ounce. not every week.   Drug use: Not Currently     Types: Marijuana    Comment: Occasional marijuana, none since 2023   Sexual activity: Yes  Other Topics Concern   Not on file  Social History Narrative   Not on file   Social Determinants of Health   Financial Resource Strain: Not on file  Food Insecurity: Not on file  Transportation Needs: Not on file  Physical Activity: Not on file  Stress: Not on file  Social Connections: Not on file  Intimate Partner Violence: Not on file    Review of Systems: See HPI, otherwise negative ROS  Physical Exam: Vital signs in last 24 hours: Temp:  [97.7 F (36.5 C)] 97.7 F (36.5 C) (09/30 0657) Resp:  [14] 14 (09/30 0657) BP: (159)/(65) 159/65 (09/30 0657) SpO2:  [99 %] 99 % (09/30 0657)   General:   Alert,  Well-developed, well-nourished, pleasant and cooperative in NAD Head:  Normocephalic and atraumatic. Eyes:  Sclera clear, no icterus.   Conjunctiva pink. Ears:  Normal auditory acuity. Nose:  No deformity, discharge,  or lesions. Msk:  Symmetrical without gross deformities. Normal posture. Extremities:  Without clubbing or edema. Neurologic:  Alert and  oriented x4;  grossly normal neurologically. Skin:  Intact without significant lesions or rashes. Psych:  Alert and cooperative. Normal mood and affect.  Impression/Plan: [name] is here for a colonoscopy to be performed for surveillance purposes, personal history of adenomatous colon polyps in 2021  The risks of the procedure including infection, bleed, or perforation as well as benefits, limitations, alternatives and imponderables have been reviewed with the patient. Questions have been answered. All parties agreeable.

## 2023-07-12 NOTE — Discharge Instructions (Signed)
  Colonoscopy Discharge Instructions  Read the instructions outlined below and refer to this sheet in the next few weeks. These discharge instructions provide you with general information on caring for yourself after you leave the hospital. Your doctor may also give you specific instructions. While your treatment has been planned according to the most current medical practices available, unavoidable complications occasionally occur.   ACTIVITY You may resume your regular activity, but move at a slower pace for the next 24 hours.  Take frequent rest periods for the next 24 hours.  Walking will help get rid of the air and reduce the bloated feeling in your belly (abdomen).  No driving for 24 hours (because of the medicine (anesthesia) used during the test).   Do not sign any important legal documents or operate any machinery for 24 hours (because of the anesthesia used during the test).  NUTRITION Drink plenty of fluids.  You may resume your normal diet as instructed by your doctor.  Begin with a light meal and progress to your normal diet. Heavy or fried foods are harder to digest and may make you feel sick to your stomach (nauseated).  Avoid alcoholic beverages for 24 hours or as instructed.  MEDICATIONS You may resume your normal medications unless your doctor tells you otherwise.  WHAT YOU CAN EXPECT TODAY Some feelings of bloating in the abdomen.  Passage of more gas than usual.  Spotting of blood in your stool or on the toilet paper.  IF YOU HAD POLYPS REMOVED DURING THE COLONOSCOPY: No aspirin products for 7 days or as instructed.  No alcohol for 7 days or as instructed.  Eat a soft diet for the next 24 hours.  FINDING OUT THE RESULTS OF YOUR TEST Not all test results are available during your visit. If your test results are not back during the visit, make an appointment with your caregiver to find out the results. Do not assume everything is normal if you have not heard from your  caregiver or the medical facility. It is important for you to follow up on all of your test results.  SEEK IMMEDIATE MEDICAL ATTENTION IF: You have more than a spotting of blood in your stool.  Your belly is swollen (abdominal distention).  You are nauseated or vomiting.  You have a temperature over 101.  You have abdominal pain or discomfort that is severe or gets worse throughout the day.   Your colonoscopy revealed 7 polyp(s) which I removed successfully. Await pathology results, my office will contact you. I recommend repeating colonoscopy in 3 years for surveillance purposes.   One of the polyps bled after polypectomy.  I placed a metallic clip for treatment.  This clip will naturally fall off over the next few months.  If you need an MRI for any reason in the near future you will need x-ray prior to ensure clip is no longer present.  You also have diverticulosis and internal hemorrhoids. I would recommend increasing fiber in your diet or adding OTC Benefiber/Metamucil. Be sure to drink at least 4 to 6 glasses of water daily. Follow-up with GI as needed.   I hope you have a great rest of your week!  Hennie Duos. Marletta Lor, D.O. Gastroenterology and Hepatology [facility]

## 2023-07-12 NOTE — Anesthesia Postprocedure Evaluation (Signed)
Anesthesia Post Note  Patient: [name]  Procedure(s) Performed: COLONOSCOPY WITH PROPOFOL POLYPECTOMY  Patient location during evaluation: Phase II Anesthesia Type: General Level of consciousness: awake Pain management: pain level controlled Vital Signs Assessment: post-procedure vital signs reviewed and stable Respiratory status: spontaneous breathing and respiratory function stable Cardiovascular status: blood pressure returned to baseline and stable Postop Assessment: no headache and no apparent nausea or vomiting Anesthetic complications: no Comments: Late entry   No notable events documented.   Last Vitals:  Vitals:   07/12/23 0805 07/12/23 0813  BP: (!) 91/44   Pulse: 81   Resp:  12  Temp: (!) 36.3 C   SpO2: 99%     Last Pain:  Vitals:   07/12/23 0805  TempSrc: Axillary  PainSc:                  Windell Norfolk

## 2023-07-12 NOTE — Transfer of Care (Signed)
Immediate Anesthesia Transfer of Care Note  Patient: [name]  Procedure(s) Performed: COLONOSCOPY WITH PROPOFOL POLYPECTOMY  Patient Location: Short Stay  Anesthesia Type:General  Level of Consciousness: awake, alert , and oriented  Airway & Oxygen Therapy: Patient Spontanous Breathing  Post-op Assessment: Report given to RN and Post -op Vital signs reviewed and stable  Post vital signs: Reviewed and stable  Last Vitals:  Vitals Value Taken Time  BP 91/44 07/12/23 0805  Temp 36.3 C 07/12/23 0805  Pulse 81 07/12/23 0809  Resp 23 07/12/23 0809  SpO2 100 % 07/12/23 0809  Vitals shown include unfiled device data.  Last Pain:  Vitals:   07/12/23 0805  TempSrc: Axillary  PainSc:       Patients Stated Pain Goal: 7 (07/12/23 0657)  Complications: No notable events documented.

## 2023-07-12 NOTE — Anesthesia Preprocedure Evaluation (Signed)
Anesthesia Evaluation  Patient identified by MRN, date of birth, ID band Patient awake    Reviewed: Allergy & Precautions, H&P , NPO status , Patient's Chart, lab work & pertinent test results, reviewed documented beta blocker date and time   Airway Mallampati: II  TM Distance: >3 FB Neck ROM: full    Dental no notable dental hx.    Pulmonary neg pulmonary ROS, asthma , Current Smoker and Patient abstained from smoking.   Pulmonary exam normal breath sounds clear to auscultation       Cardiovascular Exercise Tolerance: Good hypertension, negative cardio ROS  Rhythm:regular Rate:Normal     Neuro/Psych negative neurological ROS  negative psych ROS   GI/Hepatic negative GI ROS, Neg liver ROS,GERD  ,,(+) Hepatitis -  Endo/Other  negative endocrine ROSdiabetes    Renal/GU negative Renal ROS  negative genitourinary   Musculoskeletal   Abdominal   Peds  Hematology negative hematology ROS (+)   Anesthesia Other Findings   Reproductive/Obstetrics negative OB ROS                             Anesthesia Physical Anesthesia Plan  ASA: 2  Anesthesia Plan: General   Post-op Pain Management:    Induction:   PONV Risk Score and Plan: Propofol infusion  Airway Management Planned:   Additional Equipment:   Intra-op Plan:   Post-operative Plan:   Informed Consent: I have reviewed the patients History and Physical, chart, labs and discussed the procedure including the risks, benefits and alternatives for the proposed anesthesia with the patient or authorized representative who has indicated his/her understanding and acceptance.     Dental Advisory Given  Plan Discussed with: CRNA  Anesthesia Plan Comments:        Anesthesia Quick Evaluation

## 2023-07-13 LAB — SURGICAL PATHOLOGY

## 2023-07-14 DIAGNOSIS — B078 Other viral warts: Secondary | ICD-10-CM | POA: Diagnosis not present

## 2023-07-19 ENCOUNTER — Encounter (HOSPITAL_COMMUNITY): Payer: Self-pay | Admitting: Internal Medicine

## 2023-08-11 DIAGNOSIS — L859 Epidermal thickening, unspecified: Secondary | ICD-10-CM | POA: Diagnosis not present

## 2023-08-11 DIAGNOSIS — B078 Other viral warts: Secondary | ICD-10-CM | POA: Diagnosis not present

## 2023-08-25 DIAGNOSIS — L720 Epidermal cyst: Secondary | ICD-10-CM | POA: Diagnosis not present

## 2023-09-13 DIAGNOSIS — L720 Epidermal cyst: Secondary | ICD-10-CM | POA: Diagnosis not present

## 2023-09-13 DIAGNOSIS — B078 Other viral warts: Secondary | ICD-10-CM | POA: Diagnosis not present

## 2023-09-13 DIAGNOSIS — L859 Epidermal thickening, unspecified: Secondary | ICD-10-CM | POA: Diagnosis not present

## 2023-10-25 DIAGNOSIS — R311 Benign essential microscopic hematuria: Secondary | ICD-10-CM | POA: Diagnosis not present

## 2023-10-25 DIAGNOSIS — N390 Urinary tract infection, site not specified: Secondary | ICD-10-CM | POA: Diagnosis not present

## 2023-10-25 DIAGNOSIS — M109 Gout, unspecified: Secondary | ICD-10-CM | POA: Diagnosis not present

## 2023-10-25 DIAGNOSIS — Z720 Tobacco use: Secondary | ICD-10-CM | POA: Diagnosis not present

## 2023-10-25 DIAGNOSIS — I10 Essential (primary) hypertension: Secondary | ICD-10-CM | POA: Diagnosis not present

## 2023-10-25 DIAGNOSIS — E1169 Type 2 diabetes mellitus with other specified complication: Secondary | ICD-10-CM | POA: Diagnosis not present

## 2024-01-11 DIAGNOSIS — X32XXXA Exposure to sunlight, initial encounter: Secondary | ICD-10-CM | POA: Diagnosis not present

## 2024-01-11 DIAGNOSIS — L570 Actinic keratosis: Secondary | ICD-10-CM | POA: Diagnosis not present

## 2024-01-11 DIAGNOSIS — L280 Lichen simplex chronicus: Secondary | ICD-10-CM | POA: Diagnosis not present

## 2024-02-22 DIAGNOSIS — I700 Atherosclerosis of aorta: Secondary | ICD-10-CM | POA: Diagnosis not present

## 2024-02-22 DIAGNOSIS — E1169 Type 2 diabetes mellitus with other specified complication: Secondary | ICD-10-CM | POA: Diagnosis not present

## 2024-02-22 DIAGNOSIS — M545 Low back pain, unspecified: Secondary | ICD-10-CM | POA: Diagnosis not present

## 2024-02-22 DIAGNOSIS — J432 Centrilobular emphysema: Secondary | ICD-10-CM | POA: Diagnosis not present

## 2024-02-22 DIAGNOSIS — I10 Essential (primary) hypertension: Secondary | ICD-10-CM | POA: Diagnosis not present

## 2024-02-22 DIAGNOSIS — Z720 Tobacco use: Secondary | ICD-10-CM | POA: Diagnosis not present

## 2024-05-22 ENCOUNTER — Other Ambulatory Visit (HOSPITAL_COMMUNITY): Payer: Self-pay | Admitting: Family Medicine

## 2024-05-22 DIAGNOSIS — J432 Centrilobular emphysema: Secondary | ICD-10-CM

## 2024-06-02 ENCOUNTER — Ambulatory Visit (HOSPITAL_COMMUNITY)
Admission: RE | Admit: 2024-06-02 | Discharge: 2024-06-02 | Disposition: A | Discharge status: Home / Self Care | Source: Ambulatory Visit | Attending: Family Medicine | Admitting: Family Medicine

## 2024-06-02 DIAGNOSIS — J432 Centrilobular emphysema: Secondary | ICD-10-CM | POA: Diagnosis not present

## 2024-06-02 DIAGNOSIS — I700 Atherosclerosis of aorta: Secondary | ICD-10-CM | POA: Insufficient documentation

## 2024-06-02 DIAGNOSIS — Z122 Encounter for screening for malignant neoplasm of respiratory organs: Secondary | ICD-10-CM | POA: Diagnosis not present

## 2024-06-02 DIAGNOSIS — F17210 Nicotine dependence, cigarettes, uncomplicated: Secondary | ICD-10-CM | POA: Insufficient documentation

## 2024-06-06 ENCOUNTER — Ambulatory Visit
Admission: EM | Admit: 2024-06-06 | Discharge: 2024-06-06 | Disposition: A | Discharge status: Home / Self Care | Attending: Nurse Practitioner | Admitting: Nurse Practitioner

## 2024-06-06 DIAGNOSIS — T7840XA Allergy, unspecified, initial encounter: Secondary | ICD-10-CM

## 2024-06-06 MED ORDER — DIPHENHYDRAMINE HCL 50 MG PO CAPS
50.0000 mg | ORAL_CAPSULE | Freq: Once | ORAL | Status: AC
  Administered 2024-06-06: 50 mg via ORAL

## 2024-06-06 MED ORDER — FAMOTIDINE 40 MG PO TABS
40.0000 mg | ORAL_TABLET | Freq: Once | ORAL | Status: AC
  Administered 2024-06-06: 40 mg via ORAL

## 2024-06-06 MED ORDER — FAMOTIDINE 20 MG PO TABS: 20.0000 mg | ORAL_TABLET | Freq: Every day | ORAL | 0 refills | Status: AC

## 2024-06-06 NOTE — ED Triage Notes (Signed)
 Pt reports rash after eating peanuts about 3 hours ago, itching all over. Denies difficulty breathing,swallowing. Pt took a shower to help with the itching.

## 2024-06-06 NOTE — ED Provider Notes (Signed)
 [facility]    CSN: [id number] Arrival date & time: 06/06/24  1738      History   Chief Complaint No chief complaint on file.   HPI [name] is a 68 y.o. male.   The history is provided by the patient.   Patient presents for complaints of itching and welts after eating a handful of peanuts.  Patient states that shortly after eating the peanuts, he develops symptoms.  States that he always eats these types of peanuts, and states that they had a funny taste after he ate them.  Patient denies chest pain, shortness of breath, difficulty breathing, tongue swelling, throat swelling, wheezing, or cough.  Patient states after he developed the rash, he went home and took a shower, he has not tried any other medications for his symptoms.  Reports that his doctors at the [facility] told him I am allergic to everything.  Patient reports prior history of diabetes, states his A1c was more than 10.  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)    Chronic hepatitis C (HCC)    successfully treated at [facility], pretreatment liver biopsy with no advance disease   Cocaine dependence (HCC)    remote   Diabetes mellitus without complication (HCC)    Dysphagia    Hepatitis    Hypertension    Personal history of colonic polyps     Patient Active Problem List   Diagnosis Date Noted   GERD (gastroesophageal reflux disease) 01/10/2018   History of adenomatous polyp of colon 03/19/2016   OTHER DYSPHAGIA 06/24/2010   ABDOMINAL PAIN, GENERALIZED 06/24/2010   Cocaine dependence (HCC) 06/20/2010   History of colonic polyps 06/20/2010   Alcohol abuse 03/20/2008   EPIGASTRIC PAIN 03/20/2008    Past Surgical History:  Procedure Laterality Date   BIOPSY  05/05/2016   Procedure: BIOPSY;  Surgeon: Margo LITTIE Haddock, MD;  [facility];  Service: Endoscopy;;  transverse colon polyp   COLONOSCOPY     SLF: Multiple  polyps, simple adenoma. NEXT TCS 04/2019   COLONOSCOPY WITH PROPOFOL  N/A 05/05/2016   Procedure: COLONOSCOPY WITH PROPOFOL ;  Surgeon: Margo LITTIE Haddock, MD;  [facility];  Service: Endoscopy;  Laterality: N/A;  1000   COLONOSCOPY WITH PROPOFOL  N/A 06/04/2020   Procedure: COLONOSCOPY WITH PROPOFOL ;  Surgeon: Cindie Carlin POUR, DO;  [facility];  Service: Endoscopy;  Laterality: N/A;  11:15am   COLONOSCOPY WITH PROPOFOL  N/A 07/12/2023   Procedure: COLONOSCOPY WITH PROPOFOL ;  Surgeon: Cindie Carlin POUR, DO;  [facility];  Service: Endoscopy;  Laterality: N/A;  8:00 am, asa 3   ESOPHAGOGASTRODUODENOSCOPY  2011   Dr. Haddock: Probable esophageal web status post dilation, gastritis.  Biopsies benign, no H. pylori   KNEE ARTHROPLASTY Left    KNEE ARTHROSCOPY Right    POLYPECTOMY  05/05/2016   Procedure: POLYPECTOMY;  Surgeon: Margo LITTIE Haddock, MD;  [facility];  Service: Endoscopy;;  transverse colon, descending colon and sigmoid colon polyps   POLYPECTOMY  06/04/2020   Procedure: POLYPECTOMY;  Surgeon: Cindie Carlin POUR, DO;  [facility];  Service: Endoscopy;;   POLYPECTOMY  07/12/2023   Procedure: POLYPECTOMY;  Surgeon: Cindie Carlin POUR, DO;  [facility];  Service: Endoscopy;;   TRIGGER FINGER RELEASE Left 2010       Home Medications  Prior to Admission medications   Medication Sig Start Date End Date Taking? Authorizing Provider  amLODipine (NORVASC) 5 MG tablet Take 5 mg by mouth daily.  12/17/11   [provider]  aspirin EC 81 MG tablet Take 81 mg by mouth daily.    [provider]  atorvastatin (LIPITOR) 20 MG tablet Take 20 mg by mouth at bedtime. 02/17/23   [provider]  clobetasol  ointment (TEMOVATE ) 0.05 % Apply to lesions on arms and hands nightly 06/24/21   Livingston Rigg, MD  esomeprazole  (NEXIUM ) 40 MG capsule Take 1 capsule (40 mg total) by mouth daily as needed (acid reflux). 03/17/23    Ezzard Sonny RAMAN, PA-C  fluticasone (FLONASE) 50 MCG/ACT nasal spray Place 2 sprays into both nostrils daily as needed for allergies or rhinitis.    [provider]  ibuprofen (ADVIL) 800 MG tablet Take 800 mg by mouth every 8 (eight) hours as needed for moderate pain.    [provider]  lisinopril-hydrochlorothiazide (PRINZIDE,ZESTORETIC) 20-12.5 MG tablet Take 2 tablets by mouth daily.  12/17/11   [provider]  loratadine (CLARITIN) 10 MG tablet Take 10 mg by mouth daily.    [provider]  metFORMIN (GLUCOPHAGE) 500 MG tablet Take 500 mg by mouth 2 (two) times daily with a meal.    [provider]    Family History Family History  Problem Relation Age of Onset   Colon cancer Neg Hx     Social History Social History   Tobacco Use   Smoking status: Every Day    Current packs/day: 1.00    Types: Cigarettes   Smokeless tobacco: Never  Vaping Use   Vaping status: Never Used  Substance Use Topics   Alcohol use: Yes    Comment: once a week, vodka, less than 8 ounce. not every week.   Drug use: Not Currently    Types: Marijuana    Comment: Occasional marijuana, none since 2023     Allergies   Patient has no known allergies.   Review of Systems Review of Systems Per HPI  Physical Exam Triage Vital Signs ED Triage Vitals  Encounter Vitals Group     BP 06/06/24 1741 124/67     Girls Systolic BP Percentile --      Girls Diastolic BP Percentile --      Boys Systolic BP Percentile --      Boys Diastolic BP Percentile --      Pulse Rate 06/06/24 1741 (!) 105     Resp 06/06/24 1741 (!) 24     Temp 06/06/24 1741 98 F (36.7 C)     Temp Source 06/06/24 1741 Oral     SpO2 06/06/24 1741 94 %     Weight --      Height --      Head Circumference --      Peak Flow --      Pain Score 06/06/24 1744 0     Pain Loc --      Pain Education --      Exclude from Growth Chart --    No data found.  Updated Vital Signs BP 124/67 (BP  Location: Right Arm)   Pulse (!) 105   Temp 98 F (36.7 C) (Oral)   Resp (!) 24   SpO2 94%   Visual Acuity Right Eye Distance:   Left Eye Distance:   Bilateral Distance:    Right Eye Near:   Left Eye Near:  Bilateral Near:     Physical Exam Vitals and nursing note reviewed.  Constitutional:      General: He is not in acute distress.    Appearance: Normal appearance.  HENT:     Head: Normocephalic.     Nose: Nose normal.     Mouth/Throat:     Mouth: Mucous membranes are moist.     Pharynx: No posterior oropharyngeal erythema.     Comments: Airway is clear and patent, no obstruction present Eyes:     Extraocular Movements: Extraocular movements intact.     Conjunctiva/sclera: Conjunctivae normal.     Pupils: Pupils are equal, round, and reactive to light.  Cardiovascular:     Rate and Rhythm: Normal rate and regular rhythm.     Pulses: Normal pulses.     Heart sounds: Normal heart sounds.  Pulmonary:     Effort: Pulmonary effort is normal. No respiratory distress.     Breath sounds: Normal breath sounds. No wheezing, rhonchi or rales.  Musculoskeletal:     Cervical back: Normal range of motion.  Skin:    General: Skin is warm and dry.     Findings: Rash present. Rash is urticarial.     Comments: Urticarial rash noted to the generalized body surface area.  The rash is in no congruent pattern.  There is no oozing, fluctuance, or drainage present.  Neurological:     General: No focal deficit present.     Mental Status: He is alert and oriented to person, place, and time.  Psychiatric:        Mood and Affect: Mood normal.        Behavior: Behavior normal.      UC Treatments / Results  Labs (all labs ordered are listed, but only abnormal results are displayed) Labs Reviewed - No data to display  EKG   Radiology No results found.  Procedures Procedures (including critical care time)  Medications Ordered in UC Medications  diphenhydrAMINE  (BENADRYL )  capsule 50 mg (50 mg Oral Given 06/06/24 1751)    Initial Impression / Assessment and Plan / UC Course  I have reviewed the triage vital signs and the nursing notes.  Pertinent labs & imaging results that were available during my care of the patient were reviewed by me and considered in my medical decision making (see chart for details).  Patient with possible allergic reaction to peanuts.  On exam, lung sounds are clear throughout, room air sats at 94%.  There is no wheezing, shortness of breath, difficulty breathing, airway is clear and patent.  Patient declines steroid injection, Benadryl  50 mg p.o. and famotidine  40 mg given for allergic reaction.  Will prescribe famotidine  20 mg for patient to begin daily.  Supportive care recommendations were provided and discussed with the patient to include continuing Benadryl , to avoid peanuts, and to monitor for worsening symptoms.  Patient was given strict ER follow-up precautions.  Patient also advised to follow-up with his PCP to advise of his possible allergic reaction.  Patient was in agreement with this plan of care and verbalizes understanding.  All questions were answered.  Patient stable for discharge.   Final Clinical Impressions(s) / UC Diagnoses   Final diagnoses:  None   Discharge Instructions   None    ED Prescriptions   None    PDMP not reviewed this encounter.   Gilmer Etta PARAS, NP 06/06/24 1811

## 2024-06-06 NOTE — Discharge Instructions (Addendum)
 It appears you may have had an allergic reaction to peanuts.  You were given Benadryl  50 mg.  Continue taking Benadryl  every 6-8 hours to help with itching and the allergic reaction. Take I have sent a prescription over for you for famotidine .  Begin taking the medication daily. Avoid peanuts until you have had further allergy testing. Go to the emergency department immediately if you experience shortness of breath, difficulty breathing, tongue swelling, throat swelling, wheezing, or other concerns. Follow-up as needed.

## 2024-06-27 DIAGNOSIS — R21 Rash and other nonspecific skin eruption: Secondary | ICD-10-CM | POA: Diagnosis not present

## 2024-06-27 DIAGNOSIS — Z23 Encounter for immunization: Secondary | ICD-10-CM | POA: Diagnosis not present

## 2024-06-27 DIAGNOSIS — J431 Panlobular emphysema: Secondary | ICD-10-CM | POA: Diagnosis not present

## 2024-06-27 DIAGNOSIS — J432 Centrilobular emphysema: Secondary | ICD-10-CM | POA: Diagnosis not present

## 2024-06-27 DIAGNOSIS — J301 Allergic rhinitis due to pollen: Secondary | ICD-10-CM | POA: Diagnosis not present

## 2024-06-27 DIAGNOSIS — E1169 Type 2 diabetes mellitus with other specified complication: Secondary | ICD-10-CM | POA: Diagnosis not present

## 2024-06-27 DIAGNOSIS — Z720 Tobacco use: Secondary | ICD-10-CM | POA: Diagnosis not present

## 2024-06-27 DIAGNOSIS — K421 Umbilical hernia with gangrene: Secondary | ICD-10-CM | POA: Diagnosis not present

## 2024-06-27 DIAGNOSIS — I10 Essential (primary) hypertension: Secondary | ICD-10-CM | POA: Diagnosis not present

## 2024-06-27 DIAGNOSIS — I700 Atherosclerosis of aorta: Secondary | ICD-10-CM | POA: Diagnosis not present

## 2024-08-09 ENCOUNTER — Encounter: Payer: Self-pay | Admitting: *Deleted

## 2024-08-09 NOTE — Progress Notes (Signed)
 ADRYAN SHIN                                          MRN: [id number]   08/09/2024   The [facility] Quality Team Specialist reviewed this patient medical record for the purposes of chart review for care gap closure. The following were reviewed: chart review for care gap closure-diabetic eye exam and kidney health evaluation for diabetes:eGFR  and uACR.    [facility] Quality Team

## 2024-09-27 NOTE — Progress Notes (Signed)
 [name]                                          MRN: [id number]   09/27/2024   The [facility] Quality Team Specialist reviewed this patient medical record for the purposes of chart review for care gap closure. The following were reviewed: chart review for care gap closure-diabetic eye exam, glycemic status assessment, and kidney health evaluation for diabetes:eGFR  and uACR.    [facility] Quality Team
